# Patient Record
Sex: Male | Born: 1975 | Race: White | Hispanic: No | Marital: Single | Smoking: Former smoker
Health system: Southern US, Community
[De-identification: ages and names within clinical notes are randomized; demographics above are authoritative.]

## PROBLEM LIST (undated history)

## (undated) DIAGNOSIS — T7840XA Allergy, unspecified, initial encounter: Secondary | ICD-10-CM

## (undated) DIAGNOSIS — M87051 Idiopathic aseptic necrosis of right femur: Secondary | ICD-10-CM

## (undated) DIAGNOSIS — K5792 Diverticulitis of intestine, part unspecified, without perforation or abscess without bleeding: Secondary | ICD-10-CM

## (undated) DIAGNOSIS — M87052 Idiopathic aseptic necrosis of left femur: Secondary | ICD-10-CM

## (undated) DIAGNOSIS — R0602 Shortness of breath: Secondary | ICD-10-CM

## (undated) DIAGNOSIS — N420 Calculus of prostate: Secondary | ICD-10-CM

## (undated) DIAGNOSIS — E785 Hyperlipidemia, unspecified: Secondary | ICD-10-CM

## (undated) DIAGNOSIS — R079 Chest pain, unspecified: Secondary | ICD-10-CM

## (undated) HISTORY — DX: Shortness of breath: R06.02

## (undated) HISTORY — PX: COLONOSCOPY: SHX174

## (undated) HISTORY — PX: LUMBAR SPINE SURGERY: SHX701

## (undated) HISTORY — DX: Allergy, unspecified, initial encounter: T78.40XA

## (undated) HISTORY — DX: Hyperlipidemia, unspecified: E78.5

## (undated) HISTORY — DX: Chest pain, unspecified: R07.9

## (undated) HISTORY — DX: Idiopathic aseptic necrosis of left femur: M87.052

## (undated) HISTORY — DX: Idiopathic aseptic necrosis of right femur: M87.051

---

## 2002-12-22 ENCOUNTER — Ambulatory Visit (HOSPITAL_COMMUNITY): Admission: RE | Admit: 2002-12-22 | Discharge: 2002-12-22 | Payer: Self-pay | Admitting: Orthopedic Surgery

## 2002-12-22 ENCOUNTER — Encounter: Payer: Self-pay | Admitting: Orthopedic Surgery

## 2008-12-11 HISTORY — PX: VASECTOMY: SHX75

## 2010-02-01 ENCOUNTER — Ambulatory Visit: Payer: Self-pay | Admitting: Family Medicine

## 2010-02-02 LAB — CONVERTED CEMR LAB
ALT: 33 units/L (ref 0–53)
AST: 22 units/L (ref 0–37)
Albumin: 4.9 g/dL (ref 3.5–5.2)
Alkaline Phosphatase: 45 units/L (ref 39–117)
BUN: 15 mg/dL (ref 6–23)
CO2: 28 meq/L (ref 19–32)
Calcium: 9.5 mg/dL (ref 8.4–10.5)
Chloride: 102 meq/L (ref 96–112)
Cholesterol: 200 mg/dL (ref 0–200)
Creatinine, Ser: 1.05 mg/dL (ref 0.40–1.50)
Glucose, Bld: 91 mg/dL (ref 70–99)
HDL: 56 mg/dL (ref >39)
LDL Cholesterol: 107 mg/dL — ABNORMAL HIGH (ref 0–99)
Potassium: 4.4 meq/L (ref 3.5–5.3)
Sodium: 141 meq/L (ref 135–145)
Total Bilirubin: 1.1 mg/dL (ref 0.3–1.2)
Total CHOL/HDL Ratio: 3.6
Total Protein: 7.7 g/dL (ref 6.0–8.3)
Triglycerides: 186 mg/dL — ABNORMAL HIGH (ref <150)
VLDL: 37 mg/dL (ref 0–40)

## 2010-09-27 ENCOUNTER — Ambulatory Visit: Payer: Self-pay | Admitting: Family Medicine

## 2010-09-27 DIAGNOSIS — R109 Unspecified abdominal pain: Secondary | ICD-10-CM

## 2010-09-27 DIAGNOSIS — J069 Acute upper respiratory infection, unspecified: Secondary | ICD-10-CM | POA: Insufficient documentation

## 2010-09-28 DIAGNOSIS — R799 Abnormal finding of blood chemistry, unspecified: Secondary | ICD-10-CM

## 2010-09-28 LAB — CONVERTED CEMR LAB
ALT: 39 units/L (ref 0–53)
AST: 26 units/L (ref 0–37)
Albumin: 5 g/dL (ref 3.5–5.2)
Alkaline Phosphatase: 62 units/L (ref 39–117)
BUN: 15 mg/dL (ref 6–23)
Basophils Absolute: 0 10*3/uL (ref 0.0–0.1)
Basophils Relative: 0 % (ref 0–1)
CO2: 23 meq/L (ref 19–32)
Calcium: 9.4 mg/dL (ref 8.4–10.5)
Chloride: 103 meq/L (ref 96–112)
Creatinine, Ser: 1.15 mg/dL (ref 0.40–1.50)
Eosinophils Absolute: 0.2 10*3/uL (ref 0.0–0.7)
Eosinophils Relative: 4 % (ref 0–5)
Glucose, Bld: 88 mg/dL (ref 70–99)
HCT: 49.9 % (ref 39.0–52.0)
Hemoglobin: 16.1 g/dL (ref 13.0–17.0)
Lymphocytes Relative: 26 % (ref 12–46)
Lymphs Abs: 1.2 10*3/uL (ref 0.7–4.0)
MCHC: 32.3 g/dL (ref 30.0–36.0)
MCV: 95.4 fL (ref 78.0–100.0)
Monocytes Absolute: 0.5 10*3/uL (ref 0.1–1.0)
Monocytes Relative: 11 % (ref 3–12)
Neutro Abs: 2.8 10*3/uL (ref 1.7–7.7)
Neutrophils Relative %: 59 % (ref 43–77)
Platelets: 199 10*3/uL (ref 150–400)
Potassium: 4.3 meq/L (ref 3.5–5.3)
RBC: 5.23 M/uL (ref 4.22–5.81)
RDW: 13.7 % (ref 11.5–15.5)
Sodium: 139 meq/L (ref 135–145)
Total Bilirubin: 1.5 mg/dL — ABNORMAL HIGH (ref 0.3–1.2)
Total Protein: 7.8 g/dL (ref 6.0–8.3)
WBC: 4.8 10*3/uL (ref 4.0–10.5)

## 2010-09-29 ENCOUNTER — Ambulatory Visit: Payer: Self-pay | Admitting: Family Medicine

## 2010-09-29 LAB — CONVERTED CEMR LAB
Bilirubin Urine: NEGATIVE
Blood in Urine, dipstick: NEGATIVE
Glucose, Urine, Semiquant: NEGATIVE
Ketones, urine, test strip: NEGATIVE
Nitrite: NEGATIVE
Protein, U semiquant: NEGATIVE
Specific Gravity, Urine: 1.025
Urobilinogen, UA: 0.2
WBC Urine, dipstick: NEGATIVE
pH: 6.5

## 2010-09-30 LAB — CONVERTED CEMR LAB
Bacteria, UA: NONE SEEN
Bilirubin Urine: NEGATIVE
Casts: NONE SEEN /lpf
Creatinine, Urine: 100.7 mg/dL
Crystals: NONE SEEN
Hemoglobin, Urine: NEGATIVE
Ketones, ur: NEGATIVE mg/dL
Leukocytes, UA: NEGATIVE
Microalb Creat Ratio: 5 mg/g (ref 0.0–30.0)
Microalb, Ur: 0.5 mg/dL (ref 0.00–1.89)
Nitrite: NEGATIVE
Protein, ur: NEGATIVE mg/dL
Specific Gravity, Urine: 1.019 (ref 1.005–1.030)
Squamous Epithelial / HPF: NONE SEEN /lpf
Urine Glucose: NEGATIVE mg/dL
Urobilinogen, UA: 0.2 (ref 0.0–1.0)
pH: 6.5 (ref 5.0–8.0)

## 2010-10-07 ENCOUNTER — Encounter: Payer: Self-pay | Admitting: Family Medicine

## 2010-10-10 ENCOUNTER — Telehealth: Payer: Self-pay | Admitting: Family Medicine

## 2011-01-10 NOTE — Assessment & Plan Note (Signed)
Summary: U/A  Nurse Visit   Primary Care Provider:  Nani Gasser MD   History of Present Illness: pt dropped urine sample off fo u/a and further testing due to abn blood chemistry   Allergies: No Known Drug Allergies Laboratory Results   Urine Tests  Date/Time Received: 09/29/10 Date/Time Reported: 09/29/10  Routine Urinalysis   Color: yellow Appearance: Clear Glucose: negative   (Normal Range: Negative) Bilirubin: negative   (Normal Range: Negative) Ketone: negative   (Normal Range: Negative) Spec. Gravity: 1.025   (Normal Range: 1.003-1.035) Blood: negative   (Normal Range: Negative) pH: 6.5   (Normal Range: 5.0-8.0) Protein: negative   (Normal Range: Negative) Urobilinogen: 0.2   (Normal Range: 0-1) Nitrite: negative   (Normal Range: Negative) Leukocyte Esterace: negative   (Normal Range: Negative)       Orders Added: 1)  UA Dipstick w/o Micro (automated)  [81003]

## 2011-01-10 NOTE — Assessment & Plan Note (Signed)
Summary: URI, abd pain   Vital Signs:  Patient profile:   35 year old male Height:      69.5 inches Weight:      191 pounds Temp:     98.8 degrees F oral Pulse rate:   109 / minute BP sitting:   125 / 77  (right arm) Cuff size:   regular  Vitals Entered By: Avon Gully CMA, Duncan Dull) (September 27, 2010 10:44 AM) CC: productive cough,congestion since sunday,left ear stopped up, left eye draining this am ,cold and achey,lower abd pain, no fever   Primary Care Provider:  Nani Gasser MD  CC:  productive cough, congestion since sunday, left ear stopped up, left eye draining this am , cold and achey, lower abd pain, and no fever.  History of Present Illness: productive cough,congestion since sunday,  left ear stopped up, left eye draining this am ,cold and achey,lower abd pain, no fever. No fever. Some cold chills.  Head feels thick and bilat congestions.  Took some nyquail last night. No SOB. + sneezing.  Woke up this AM and had starp lower abdomen pain. Eased off after a few mintues. No real change in diet.  Doesn't happen during the day. NO change bowels.  No personal or family hx of GI or abdominal problems. No nausea.   Current Medications (verified): 1)  Multivitamins  Caps (Multiple Vitamin) .... One Tablet By Mouth Once A Day  Allergies (verified): No Known Drug Allergies  Comments:  Nurse/Medical Assistant: The patient's medications and allergies were reviewed with the patient and were updated in the Medication and Allergy Lists. Avon Gully CMA, Duncan Dull) (September 27, 2010 10:46 AM)  Past History:  Past Surgical History: Last updated: 02/01/2010 Vasectomy Jan 2010  Physical Exam  General:  Well-developed,well-nourished,in no acute distress; alert,appropriate and cooperative throughout examination Head:  Normocephalic and atraumatic without obvious abnormalities. No apparent alopecia or balding. Eyes:  No corneal or conjunctival inflammation noted. EOMI.  Perrla. Ears:  External ear exam shows no significant lesions or deformities.  Otoscopic examination reveals clear canals, tympanic membranes are intact bilaterally without bulging, retraction, inflammation or discharge. Hearing is grossly normal bilaterally. Nose:  External nasal examination shows no deformity or inflammation. Nasal mucosa are pink and moist without lesions or exudates. Mouth:  Oral mucosa and oropharynx without lesions or exudates.  Teeth in good repair. Neck:  No deformities, masses, or tenderness noted. Lungs:  Normal respiratory effort, chest expands symmetrically. Lungs are clear to auscultation, no crackles or wheezes. Heart:  Normal rate and regular rhythm. S1 and S2 normal without gallop, murmur, click, rub or other extra sounds. Abdomen:  Very tender to palpationon teh RLQ and LLQ with radiation of paint to opposite side.  soft, normal bowel sounds, no distention, no guarding, no rigidity, no rebound tenderness, no hepatomegaly, and no splenomegaly.   Pulses:  Radial 2+  Skin:  no rashes.   Cervical Nodes:  No lymphadenopathy noted Psych:  Cognition and judgment appear intact. Alert and cooperative with normal attention span and concentration. No apparent delusions, illusions, hallucinations   Impression & Recommendations:  Problem # 1:  URI (ICD-465.9)  Instructed on symptomatic treatment. Call if symptoms persist or worsen.   Problem # 2:  ABDOMINAL PAIN, LOWER (ICD-789.09) Unclear etiology. Will get labs to rule out infectious cuase. No change in bowels and no blood in stool. Call if sxs get worse  or if not getting better.  Will call with lab results. Pain is so short lived  that I don't think pain relievers would be helpful at this time.  Orders: T-Comprehensive Metabolic Panel [id number]) T-CBC w/Diff ([id number])  Complete Medication List: 1)  Multivitamins Caps (Multiple vitamin) .... One tablet by mouth once a day  Patient Instructions: 1)  You have  a cold. Can use the nyquail and daytime med to help relieve your symptoms. If you are not better in one week then call the office 2)  We will call you with your lab results. If abdominal pain gets worse or notice a change in bowels or blood in teh stool then call our office.   Orders Added: 1)  T-Comprehensive Metabolic Panel [[id number]] 2)  T-CBC w/Diff [[id number]] 3)  Est. Patient Level IV [96295]

## 2011-01-10 NOTE — Progress Notes (Signed)
Summary: Renal US.   Phone Note Outgoing Call   Summary of Call: Call pt: [place] of kidneys is normal. Good news. We can just follow the blood work and recheck in 6 months.  Initial call taken by: Nani Gasser MD,  October 10, 2010 10:43 AM  Follow-up for Phone Call        called and spoke with pt's wife and notified her of results Follow-up by: Avon Gully CMA, Duncan Dull),  October 10, 2010 11:31 AM

## 2011-01-10 NOTE — Assessment & Plan Note (Signed)
Summary: NOV: CPE   Vital Signs:  Patient profile:   35 year old male Height:      69.5 inches Weight:      191 pounds BMI:     27.90 Pulse rate:   70 / minute BP sitting:   119 / 69  (left arm) Cuff size:   regular  Vitals Entered By: Kathlene November (February 01, 2010 8:23 AM) CC: NP- get established and CPE Is Patient Diabetic? No   Primary Care Provider:  Nani Gasser MD  CC:  NP- get established and CPE.  History of Present Illness: Had an exercise treadmill stress tes about 5-6 years agot. Having intermittant chest pain. Feels like an elephant on the chest.  Seems to be random. Happens when more stressed.  Can last 3-4 days at a  time. No neck pain. No SOB.  No arm numbness or pain.  No CP over the last several weeks.   Habits & Providers  Alcohol-Tobacco-Diet     Alcohol drinks/day: <1     Tobacco Status: quit     Year Quit: 1999  Exercise-Depression-Behavior     Does Patient Exercise: yes     Type of exercise: weight lifting, cardio     Exercise (avg: min/session): >60     Times/week: <3     STD Risk: never     Drug Use: no     Seat Belt Use: always  Current Medications (verified): 1)  None  Allergies (verified): No Known Drug Allergies  Comments:  Nurse/Medical Assistant: The patient's medications and allergies were reviewed with the patient and were updated in the Medication and Allergy Lists. 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  Some college. Married ot [place] with 2 kids.  Former Smoker Alcohol , 3-6/ week  Drug use-no Regular exercise-yes, 2-3 x per week.  Smoking Status:  quit Does Patient Exercise:  yes STD Risk:  never Drug Use:  no Seat Belt Use:  always  Review of Systems       No  fever/sweats/weakness, unexplained weight loss/gain.  No vison changes.  No difficulty hearing/ringing in ears, hay fever/allergies.  + chest pain/discomfort, no palpitations.  No Br lump/nipple discharge.  No cough/wheeze.  No blood in BM, nausea/vomiting/diarrhea.  No nighttime urination, leaking urine, unusual vaginal bleeding, discharge (penis or vagina).  No muscle/joint pain. No rash, change in mole.  No HA, memory loss.  No anxiety, sleep d/o, depression.  No easy bruising/bleeding, unexplained lump   Physical Exam  General:  Well-developed,well-nourished,in no acute distress; alert,appropriate and cooperative throughout examination Head:  Normocephalic and atraumatic without obvious abnormalities. No apparent alopecia or balding. Eyes:  No corneal or conjunctival inflammation noted. EOMI. Perrla. Ears:  External ear exam shows no significant lesions or deformities.  Otoscopic examination reveals clear canals, tympanic membranes are intact bilaterally without bulging, retraction, inflammation or discharge. Hearing is grossly normal bilaterally. Nose:  External nasal examination shows no deformity or inflammation. Nasal mucosa are pink and moist without lesions or exudates. Mouth:  Oral mucosa and oropharynx without lesions or exudates.  Teeth in good repair. Neck:  No deformities, masses, or tenderness noted. Lungs:  Normal respiratory effort, chest expands symmetrically. Lungs are clear to auscultation, no crackles or wheezes. Heart:  Normal rate and regular rhythm. S1 and S2  normal without gallop, murmur, click, rub or other extra sounds. No carotid or abdominal bruits.  Abdomen:  Bowel sounds positive,abdomen soft and non-tender without masses, organomegaly or hernias noted. Msk:  No deformity or scoliosis noted of thoracic or lumbar spine.   Pulses:  R and L carotid,radial,dorsalis pedis and posterior tibial pulses are full and equal bilaterally Extremities:  No clubbing, cyanosis, edema,  or deformity noted with normal full range of motion of all joints.   Neurologic:  No cranial nerve deficits noted. Station and gait are normal.  DTRs are symmetrical throughout. Sensory, motor and coordinative functions appear intact. Skin:  no rashes.   Cervical Nodes:  No lymphadenopathy noted Psych:  Cognition and judgment appear intact. Alert and cooperative with normal attention span and concentration. No apparent delusions, illusions, hallucinations   Impression & Recommendations:  Problem # 1:  Preventive Health Care (ICD-V70.0) Exam is normal.  Tetanus is up to date.  Encourage more regular exercise and low fat diet.  Due for screening labs.   Other Orders: T-Comprehensive Metabolic Panel [id number]) T-Lipid Profile [id number])  Appended Document: NOV: CPE  TD Result Date:  12/12/2003 TD Result:  given TD Next Due:  10 yr

## 2011-04-12 ENCOUNTER — Ambulatory Visit (INDEPENDENT_AMBULATORY_CARE_PROVIDER_SITE_OTHER): Payer: 59 | Admitting: Family Medicine

## 2011-04-12 VITALS — BP 133/87 | HR 62 | Ht 69.25 in | Wt 200.0 lb

## 2011-04-12 DIAGNOSIS — E781 Pure hyperglyceridemia: Secondary | ICD-10-CM

## 2011-04-12 DIAGNOSIS — R0683 Snoring: Secondary | ICD-10-CM

## 2011-04-12 DIAGNOSIS — R0989 Other specified symptoms and signs involving the circulatory and respiratory systems: Secondary | ICD-10-CM

## 2011-04-12 DIAGNOSIS — R030 Elevated blood-pressure reading, without diagnosis of hypertension: Secondary | ICD-10-CM

## 2011-04-12 NOTE — Progress Notes (Signed)
  Subjective:    Patient ID: Andrew Frost, male    DOB: 05/07/1976, 36 y.o.   MRN: [id number]  HPI Nightly snoring. No HA in the AM.  Father has sleep apnea.  Some recent mood change b/c on prednisone for his back.  He is also in pain. Had a recent back pain. Will often nap after lunch for about 20-30 min. he does not feel he has any significant daytime fatigue. Wife has has never observed any apnea.  No throat problems. No difficulty swallowing or recurrent throat infections.  His BP has been high, the last couple times he has been at orthopedist..  She has also been in pain with his back which could be contributing. He has no prior diagnosis of high blood pressure. He says he has gained some weight while being on the prednisone. But he says he is not eating more. He does feel swollen his face and abdomen on the prednisone..   Chol was elevated last year. Due to recheck. He has tried ot make dietary changes.   Review of Systems     Objective:   Physical Exam  Constitutional: He is oriented to person, place, and time. He appears well-developed and well-nourished.  HENT:  Head: Normocephalic and atraumatic.  Nose: Nose normal.  Mouth/Throat: Oropharynx is clear and moist.       He does not have large tonsils. He does have a fair amount of fat around his neck.  Eyes: Conjunctivae are normal. Pupils are equal, round, and reactive to light.  Neck: No tracheal deviation present. No thyromegaly present.  Cardiovascular: Normal rate, regular rhythm and normal heart sounds.   Pulmonary/Chest: Effort normal and breath sounds normal.  Lymphadenopathy:    He has no cervical adenopathy.  Neurological: He is alert and oriented to person, place, and time.  Psychiatric: He has a normal mood and affect.          Assessment & Plan:  Snoring-do recommend that we schedule him for a sleep study to evaluate him for sleep apnea. If this is normal then we can certainly address the snoring itself which  might be alleviated with some type of appliance. He really doesn't have a lot of other symptoms of sleep apnea but he does have a positive family history.  Elevated blood pressure without diagnosis of hypertension-his blood pressure is borderline but is technically a call today. We discussed continuing to follow this and see if it improves especially once he is off the steroids and his pain is under better control with his back.  Hypercholesterolemia-due to recheck his lab values.

## 2011-04-12 NOTE — Patient Instructions (Signed)
We will call you with your lab results 

## 2011-04-13 LAB — LIPID PANEL
Cholesterol: 199 mg/dL (ref 0–200)
HDL: 70 mg/dL (ref >39)
Triglycerides: 122 mg/dL (ref <150)
VLDL: 24 mg/dL (ref 0–40)

## 2011-04-14 ENCOUNTER — Telehealth: Payer: Self-pay | Admitting: Family Medicine

## 2011-04-14 LAB — COMPLETE METABOLIC PANEL WITH GFR
ALT: 68 U/L — ABNORMAL HIGH (ref 0–53)
AST: 19 U/L (ref 0–37)
BUN: 22 mg/dL (ref 6–23)
Calcium: 8.9 mg/dL (ref 8.4–10.5)
Chloride: 101 mEq/L (ref 96–112)
Creat: 1.18 mg/dL (ref 0.40–1.50)
GFR, Est African American: >60 mL/min (ref >60)
Total Bilirubin: 0.9 mg/dL (ref 0.3–1.2)

## 2011-04-14 NOTE — Telephone Encounter (Signed)
Pt.notified

## 2011-04-14 NOTE — Telephone Encounter (Signed)
Call patient: Triglycerides look much better. Complete metabolic panel is okay except for her liver enzymes and elevated. Avoid Tylenol and alcohol a month recheck that liver enzyme in 3-4 weeks.

## 2011-05-03 ENCOUNTER — Encounter: Payer: Self-pay | Admitting: Family Medicine

## 2011-06-08 ENCOUNTER — Encounter: Payer: Self-pay | Admitting: Family Medicine

## 2011-11-23 ENCOUNTER — Encounter: Payer: Self-pay | Admitting: Family Medicine

## 2011-11-23 ENCOUNTER — Ambulatory Visit (INDEPENDENT_AMBULATORY_CARE_PROVIDER_SITE_OTHER): Payer: 59 | Admitting: Family Medicine

## 2011-11-23 DIAGNOSIS — J020 Streptococcal pharyngitis: Secondary | ICD-10-CM

## 2011-11-23 DIAGNOSIS — R509 Fever, unspecified: Secondary | ICD-10-CM

## 2011-11-23 DIAGNOSIS — J111 Influenza due to unidentified influenza virus with other respiratory manifestations: Secondary | ICD-10-CM

## 2011-11-23 LAB — POCT RAPID STREP A (OFFICE): Rapid Strep A Screen: POSITIVE — AB

## 2011-11-23 MED ORDER — KETOROLAC TROMETHAMINE 60 MG/2ML IM SOLN
60.0000 mg | Freq: Once | INTRAMUSCULAR | Status: AC
  Administered 2011-11-23: 60 mg via INTRAMUSCULAR

## 2011-11-23 MED ORDER — OSELTAMIVIR PHOSPHATE 75 MG PO CAPS: 75.0000 mg | ORAL_CAPSULE | Freq: Two times a day (BID) | ORAL | Status: AC

## 2011-11-23 MED ORDER — HYDROCOD POLST-CHLORPHEN POLST 10-8 MG/5ML PO LQCR: 5.0000 mL | Freq: Two times a day (BID) | ORAL | Status: DC | PRN

## 2011-11-23 MED ORDER — MELOXICAM 7.5 MG PO TABS: 7.5000 mg | ORAL_TABLET | Freq: Every day | ORAL | Status: AC

## 2011-11-23 NOTE — Progress Notes (Signed)
  Subjective:    Patient ID: Andrew Frost, male    DOB: 1976-07-12, 35 y.o.   MRN: [id number]  HPI Patient's here because of a sore throat nasal congestion since he started feeling lightheaded URI Sunday Monday but really didn't have much symptoms Tuesday evening he started feeling achy not feeling well and then Wednesday he was basically morning was full-blown of 2 with a he's felt as route and and now her status as felt worse. He has had a sore throat he has had a flu shot not at this facility but elsewhere on September and he states that he feels miserable and aching all over including his eyes chest back and legs.   Review of Systems  Constitutional: Positive for fever, chills, activity change, appetite change and fatigue.  HENT: Positive for congestion, sore throat, rhinorrhea, postnasal drip and sinus pressure.   Respiratory: Positive for cough and chest tightness.   Musculoskeletal: Positive for myalgias and arthralgias. Negative for back pain.  Skin: Negative for color change.  Neurological: Positive for dizziness.  All other systems reviewed and are negative.       Objective:   Physical Exam  Constitutional: He is oriented to person, place, and time. He appears well-developed and well-nourished.  HENT:  Head: Normocephalic.  Right Ear: External ear normal. Tympanic membrane is erythematous.  Left Ear: External ear normal. Tympanic membrane is erythematous.  Nose: Mucosal edema and rhinorrhea present. No sinus tenderness.  Mouth/Throat: Posterior oropharyngeal edema and posterior oropharyngeal erythema present.  Neck: Neck supple.  Cardiovascular: Normal rate, regular rhythm and normal heart sounds.   Pulmonary/Chest: Effort normal and breath sounds normal. No respiratory distress. He has no wheezes.  Lymphadenopathy:    He has cervical adenopathy.  Neurological: He is alert and oriented to person, place, and time.  Skin: Skin is warm.  Psychiatric: He has a normal mood  and affect. His behavior is normal.   BP 127/77  Pulse 126  Temp(Src) 100.8 F (38.2 C) (Oral)  Ht 5\' 10"  (1.778 m)  Wt 186 lb (84.369 kg)  BMI 26.69 kg/m2  SpO2 100%      . Results for orders placed in visit on 11/23/11  POCT INFLUENZA A/B      Component Value Range   Influenza A, POC Negative     Influenza B, POC Negative    POCT RAPID STREP A (OFFICE)      Component Value Range   Rapid Strep A Screen Positive (*) Negative    Assessment & Plan:  #1 probable flu since she was started having symptoms of a flulike Tuesday evening for that he is within the range of getting Tamiflu as we will give him Tamiflu 75 mg twice a day also for the cough Tussionex 1 teaspoon twice a day Mobic aches and pain and will give him a shot of Toradol now before he goes. #2 problem strep throat because he is so miserable somel and running a fever will administer LA Bicillin 1.2 million units IM and not return to work until Monday 12/17/ 2012. Return in 2 weeks for nurse's visit to have repeat strep test

## 2011-11-23 NOTE — Patient Instructions (Signed)
Influenza Facts Flu (influenza) is a contagious respiratory illness caused by the influenza viruses. It can cause mild to severe illness. While most healthy people recover from the flu without specific treatment and without complications, older people, young children, and people with certain health conditions are at higher risk for serious complications from the flu, including death. CAUSES   The flu virus is spread from person to person by respiratory droplets from coughing and sneezing.   A person can also become infected by touching an object or surface with a virus on it and then touching their mouth, eye or nose.   Adults may be able to infect others from 1 day before symptoms occur and up to 7 days after getting sick. So it is possible to give someone the flu even before you know you are sick and continue to infect others while you are sick.  SYMPTOMS   Fever (usually high).   Headache.   Tiredness (can be extreme).   Cough.   Sore throat.   Runny or stuffy nose.   Body aches.   Diarrhea and vomiting may also occur, particularly in children.   These symptoms are referred to as "flu-like symptoms". A lot of different illnesses, including the common cold, can have similar symptoms.  DIAGNOSIS   There are tests that can determine if you have the flu as long you are tested within the first 2 or 3 days of illness.   A doctor's exam and additional tests may be needed to identify if you have a disease that is a complicating the flu.  RISKS AND COMPLICATIONS  Some of the complications caused by the flu include:  Bacterial pneumonia or progressive pneumonia caused by the flu virus.   Loss of body fluids (dehydration).   Worsening of chronic medical conditions, such as heart failure, asthma, or diabetes.   Sinus problems and ear infections.  HOME CARE INSTRUCTIONS   Seek medical care early on.   If you are at high risk from complications of the flu, consult your health-care  provider as soon as you develop flu-like symptoms. Those at high risk for complications include:   People 65 years or older.   People with chronic medical conditions, including diabetes.   Pregnant women.   Young children.   Your caregiver may recommend use of an antiviral medication to help treat the flu.   If you get the flu, get plenty of rest, drink a lot of liquids, and avoid using alcohol and tobacco.   You can take over-the-counter medications to relieve the symptoms of the flu if your caregiver approves. (Never give aspirin to children or teenagers who have flu-like symptoms, particularly fever).  PREVENTION  The single best way to prevent the flu is to get a flu vaccine each fall. Other measures that can help protect against the flu are:  Antiviral Medications   A number of antiviral drugs are approved for use in preventing the flu. These are prescription medications, and a doctor should be consulted before they are used.   Habits for Good Health   Cover your nose and mouth with a tissue when you cough or sneeze, throw the tissue away after you use it.   Wash your hands often with soap and water, especially after you cough or sneeze. If you are not near water, use an alcohol-based hand cleaner.   Avoid people who are sick.   If you get the flu, stay home from work or school. Avoid contact with   other people so that you do not make them sick, too.   Try not to touch your eyes, nose, or mouth as germs ore often spread this way.  IN CHILDREN, EMERGENCY WARNING SIGNS THAT NEED URGENT MEDICAL ATTENTION:  Fast breathing or trouble breathing.   Bluish skin color.   Not drinking enough fluids.   Not waking up or not interacting.   Being so irritable that the child does not want to be held.   Flu-like symptoms improve but then return with fever and worse cough.   Fever with a rash.  IN ADULTS, EMERGENCY WARNING SIGNS THAT NEED URGENT MEDICAL ATTENTION:  Difficulty  breathing or shortness of breath.   Pain or pressure in the chest or abdomen.   Sudden dizziness.   Confusion.   Severe or persistent vomiting.  SEEK IMMEDIATE MEDICAL CARE IF:  You or someone you know is experiencing any of the symptoms above. When you arrive at the emergency center,report that you think you have the flu. You may be asked to wear a mask and/or sit in a secluded area to protect others from getting sick. MAKE SURE YOU:   Understand these instructions.   Monitor your condition.   Seek medical care if you are getting worse, or not improving.  Document Released: 11/30/2003 Document Revised: 08/09/2011 Document Reviewed: 08/26/2009 [facility] Patient Information 2012 [place], [place].Strep Infections Streptococcal (strep) infections are caused by streptococcal germs (bacteria). Strep infections are very contagious. Strep infections can occur in:  Ears.   The nose.   The throat.   Sinuses.   Skin.   Blood.   Lungs.   Spinal fluid.   Urine.  Strep throat is the most common bacterial infection in children. The symptoms of a Strep infection usually get better in 2 to 3 days after starting medicine that kills germs (antibiotics). Strep is usually not contagious after 36 to 48 hours of antibiotic treatment. Strep infections that are not treated can cause serious complications. These include gland infections, throat abscess, rheumatic fever and kidney disease. DIAGNOSIS  The diagnosis of strep is made by:  A culture for the strep germ.  TREATMENT  These infections require oral antibiotics for a full 10 days, an antibiotic shot or antibiotics given into the vein (intravenous, IV). HOME CARE INSTRUCTIONS   Be sure to finish all antibiotics even if feeling better.   Only take over-the-counter medicines for pain, discomfort and or fever, as directed by your caregiver.   Close contacts that have a fever, sore throat or illness symptoms should see their caregiver right  away.   You or your child may return to work, school or daycare if the fever and pain are better in 2 to 3 days after starting antibiotics.  SEEK MEDICAL CARE IF:   You or your child has an oral temperature above 102 F (38.9 C).   Your baby is older than 3 months with a rectal temperature of 100.5 F (38.1 C) or higher for more than 1 day.   You or your child is not better in 3 days.  SEEK IMMEDIATE MEDICAL CARE IF:   You or your child has an oral temperature above 102 F (38.9 C), not controlled by medicine.   Your baby is older than 3 months with a rectal temperature of 102 F (38.9 C) or higher.   Your baby is 75 months old or younger with a rectal temperature of 100.4 F (38 C) or higher.   There is a spreading rash.  There is difficulty swallowing or breathing.   There is increased pain or swelling.  Document Released: 01/04/2005 Document Revised: 08/09/2011 Document Reviewed: 10/13/2009 [facility] Patient Information 2012 [place], [place].

## 2015-03-29 ENCOUNTER — Encounter: Payer: Self-pay | Admitting: Family Medicine

## 2015-03-29 ENCOUNTER — Ambulatory Visit (INDEPENDENT_AMBULATORY_CARE_PROVIDER_SITE_OTHER): Payer: BLUE CROSS/BLUE SHIELD | Admitting: Family Medicine

## 2015-03-29 VITALS — BP 150/87 | HR 89 | Ht 70.0 in | Wt 175.0 lb

## 2015-03-29 DIAGNOSIS — S46819A Strain of other muscles, fascia and tendons at shoulder and upper arm level, unspecified arm, initial encounter: Secondary | ICD-10-CM | POA: Diagnosis not present

## 2015-03-29 DIAGNOSIS — M542 Cervicalgia: Secondary | ICD-10-CM

## 2015-03-29 MED ORDER — CYCLOBENZAPRINE HCL 10 MG PO TABS: 10.0000 mg | ORAL_TABLET | Freq: Every evening | ORAL | Status: DC | PRN

## 2015-03-29 NOTE — Progress Notes (Signed)
   Subjective:    Patient ID: Andrew Frost, male    DOB: 1976/02/12, 39 y.o.   MRN: [id number]  HPI Neck pain x 5 days. Wakes up tight in Am and better as the day goes on. Say has been under more stress recently.  Feels sore. Has tried to rub it and doesn't really hlep has noticed dec ROM.  Occ pain radiates into the back of scalp.  No injury or trauma.  No change in eating pattern. No other joint pain.  No popping or grinding.      Review of Systems     Objective:   Physical Exam  Constitutional: He appears well-developed and well-nourished.  HENT:  Head: Normocephalic and atraumatic.  Musculoskeletal:  Normal cervical flexion. Decreased extension. Decreased rotation left and right to about 45. He's able to go just a little bit more on the right compared to the left. Side bending is also somewhat decreased bilaterally secondary to pain. He is nontender of the cervical or thoracic spine. He does have some palpable tension and knots in the upper trapezius just above the scapula. He also has a lot of tension between the spine and the scapula. He also has a lot of tenderness at the base of the occiput near the facets  Skin: Skin is warm and dry.  Psychiatric: He has a normal mood and affect. His behavior is normal.          Assessment & Plan:  Cervical pain/strain with trapezius strain-recommend physical therapy at this point since she's had symptoms for 5 weeks with no improvement with conservative care. Recommend a trial of Flexeril at bedtime and getting him into PT. If he's not significantly improved after about a month and would like to see him back. Did not opt to do x-rays today with no history of injury or trauma per se. But if it suddenly gets worse then please call his back.

## 2015-04-09 LAB — HEMOGLOBIN A1C: HEMOGLOBIN A1C: 5.4 % (ref 4.0–6.0)

## 2015-04-09 LAB — BASIC METABOLIC PANEL: GLUCOSE: 90 mg/dL

## 2015-04-09 LAB — LIPID PANEL
Cholesterol: 204 mg/dL — AB (ref 0–200)
HDL: 53 mg/dL (ref 35–70)
LDL Cholesterol: 131 mg/dL
Triglycerides: 102 mg/dL (ref 40–160)

## 2015-04-23 ENCOUNTER — Ambulatory Visit (INDEPENDENT_AMBULATORY_CARE_PROVIDER_SITE_OTHER): Payer: BLUE CROSS/BLUE SHIELD | Admitting: Family Medicine

## 2015-04-23 ENCOUNTER — Encounter: Payer: Self-pay | Admitting: Family Medicine

## 2015-04-23 VITALS — BP 123/87 | HR 70 | Wt 178.0 lb

## 2015-04-23 DIAGNOSIS — Z0189 Encounter for other specified special examinations: Secondary | ICD-10-CM

## 2015-04-23 DIAGNOSIS — Z23 Encounter for immunization: Secondary | ICD-10-CM | POA: Diagnosis not present

## 2015-04-23 DIAGNOSIS — Z0000 Encounter for general adult medical examination without abnormal findings: Secondary | ICD-10-CM

## 2015-04-23 LAB — COMPLETE METABOLIC PANEL WITH GFR
ALK PHOS: 40 U/L (ref 39–117)
ALT: 24 U/L (ref 0–53)
AST: 18 U/L (ref 0–37)
Albumin: 4.7 g/dL (ref 3.5–5.2)
BILIRUBIN TOTAL: 1.2 mg/dL (ref 0.2–1.2)
BUN: 16 mg/dL (ref 6–23)
CALCIUM: 9.3 mg/dL (ref 8.4–10.5)
CO2: 30 mEq/L (ref 19–32)
CREATININE: 1.01 mg/dL (ref 0.50–1.35)
Chloride: 103 mEq/L (ref 96–112)
GFR, Est Non African American: >89 mL/min
Glucose, Bld: 82 mg/dL (ref 70–99)
POTASSIUM: 4.7 meq/L (ref 3.5–5.3)
SODIUM: 139 meq/L (ref 135–145)
TOTAL PROTEIN: 7.5 g/dL (ref 6.0–8.3)

## 2015-04-23 LAB — LIPID PANEL
Cholesterol: 214 mg/dL — ABNORMAL HIGH (ref 0–200)
HDL: 57 mg/dL (ref >=40)
LDL CALC: 135 mg/dL — AB (ref 0–99)
TRIGLYCERIDES: 110 mg/dL (ref <150)
Total CHOL/HDL Ratio: 3.8 Ratio
VLDL: 22 mg/dL (ref 0–40)

## 2015-04-23 MED ORDER — TETANUS-DIPHTH-ACELL PERTUSSIS 5-2.5-18.5 LF-MCG/0.5 IM SUSP
0.5000 mL | Freq: Once | INTRAMUSCULAR | Status: AC
  Administered 2015-04-23: 0.5 mL via INTRAMUSCULAR

## 2015-04-23 NOTE — Progress Notes (Signed)
Subjective:    Patient ID: Andrew Frost, male    DOB: [birth year], 39 y.o.   MRN: [id number]  HPI Here for CPE - here today for complete physical exam. He did have some blood work done through his job through [facility] [employer]. They did do a cholesterol panel as well as glucose and hemoglobin A1c. His A1c was 5.4 which is fantastic. Total cholesterol 204, HDL 53, LDL 131, checklist rides 102. BMI is 26 which puts him into the overweight category. Blood pressure looked great that day as well as pulse. Labs were from 04/09/2015.  Review of Systems comphrehensive ROS is neg.   BP 123/87 mmHg  Pulse 70  Wt 178 lb (80.74 kg)  SpO2 99%    No Known Allergies  No past medical history on file.  Past Surgical History  Procedure Laterality Date  . Vasectomy  12/2008  . Lumbar spine surgery  20012    disc issues.     History   Social History  . Marital Status: Married    Spouse Name: N/A  . Number of Children: N/A  . Years of Education: N/A   Occupational History  . Not on file.   Social History Main Topics  . Smoking status: Never Smoker   . Smokeless tobacco: Former Systems developer  . Alcohol Use: 0.0 oz/week    3-6 drink(s) per week  . Drug Use: No  . Sexual Activity: Not on file   Other Topics Concern  . Not on file   Social History Narrative   He is very active.      Family History  Problem Relation Age of Onset  . Alcohol abuse      parents  . Depression      parents  . Hypertension      grandparents  . Hyperlipidemia      grandparents  . Heart disease      Outpatient Encounter Prescriptions as of 04/23/2015  Medication Sig  . Cyanocobalamin (B-12) 1000 MCG CAPS Take by mouth.    . Multiple Vitamin (MULTIVITAMIN) capsule Take 1 capsule by mouth daily.    . [DISCONTINUED] cyclobenzaprine (FLEXERIL) 10 MG tablet Take 1 tablet (10 mg total) by mouth at bedtime as needed for muscle spasms.  . [EXPIRED] Tdap (BOOSTRIX) injection 0.5 mL    No facility-administered  encounter medications on file as of 04/23/2015.          Objective:   Physical Exam  Constitutional: He is oriented to person, place, and time. He appears well-developed and well-nourished.  HENT:  Head: Normocephalic and atraumatic.  Right Ear: External ear normal.  Left Ear: External ear normal.  Nose: Nose normal.  Mouth/Throat: Oropharynx is clear and moist.  Eyes: Conjunctivae and EOM are normal. Pupils are equal, round, and reactive to light.  Neck: Normal range of motion. Neck supple. No thyromegaly present.  Cardiovascular: Normal rate, regular rhythm, normal heart sounds and intact distal pulses.   Pulmonary/Chest: Effort normal and breath sounds normal.  Abdominal: Soft. Bowel sounds are normal. He exhibits no distension and no mass. There is no tenderness. There is no rebound and no guarding.  Musculoskeletal: Normal range of motion.  Lymphadenopathy:    He has no cervical adenopathy.  Neurological: He is alert and oriented to person, place, and time. He has normal reflexes.  Skin: Skin is warm and dry.  Psychiatric: He has a normal mood and affect. His behavior is normal. Judgment and thought content normal.  Assessment & Plan:  CPE- Keep up a regular exercise program and make sure you are eating a healthy diet Try to eat 4 servings of dairy a day, or if you are lactose intolerant take a calcium with vitamin D daily.  Your vaccines are up to date.  Due for Tdap.

## 2015-04-23 NOTE — Patient Instructions (Signed)
Keep up a regular exercise program and make sure you are eating a healthy diet Try to eat 4 servings of dairy a day, or if you are lactose intolerant take a calcium with vitamin D daily.  Your vaccines are up to date.   

## 2015-04-26 ENCOUNTER — Encounter: Payer: Self-pay | Admitting: Family Medicine

## 2015-04-26 DIAGNOSIS — E785 Hyperlipidemia, unspecified: Secondary | ICD-10-CM

## 2015-04-26 HISTORY — DX: Hyperlipidemia, unspecified: E78.5

## 2015-05-04 ENCOUNTER — Encounter: Payer: Self-pay | Admitting: Family Medicine

## 2016-01-21 ENCOUNTER — Ambulatory Visit (INDEPENDENT_AMBULATORY_CARE_PROVIDER_SITE_OTHER): Payer: BLUE CROSS/BLUE SHIELD | Admitting: Sports Medicine

## 2016-01-21 ENCOUNTER — Ambulatory Visit (HOSPITAL_BASED_OUTPATIENT_CLINIC_OR_DEPARTMENT_OTHER)
Admission: RE | Admit: 2016-01-21 | Discharge: 2016-01-21 | Disposition: A | Discharge status: Home / Self Care | Payer: BLUE CROSS/BLUE SHIELD | Source: Ambulatory Visit | Attending: Sports Medicine | Admitting: Sports Medicine

## 2016-01-21 ENCOUNTER — Encounter (HOSPITAL_BASED_OUTPATIENT_CLINIC_OR_DEPARTMENT_OTHER): Payer: Self-pay

## 2016-01-21 ENCOUNTER — Encounter: Payer: Self-pay | Admitting: Sports Medicine

## 2016-01-21 VITALS — BP 141/89 | HR 82 | Resp 18 | Wt 205.0 lb

## 2016-01-21 DIAGNOSIS — R1031 Right lower quadrant pain: Secondary | ICD-10-CM | POA: Diagnosis not present

## 2016-01-21 DIAGNOSIS — M87052 Idiopathic aseptic necrosis of left femur: Secondary | ICD-10-CM | POA: Diagnosis not present

## 2016-01-21 DIAGNOSIS — M879 Osteonecrosis, unspecified: Secondary | ICD-10-CM | POA: Insufficient documentation

## 2016-01-21 DIAGNOSIS — M87051 Idiopathic aseptic necrosis of right femur: Secondary | ICD-10-CM | POA: Diagnosis not present

## 2016-01-21 DIAGNOSIS — N420 Calculus of prostate: Secondary | ICD-10-CM | POA: Insufficient documentation

## 2016-01-21 LAB — CBC WITH DIFFERENTIAL/PLATELET
Basophils Absolute: 0 K/uL (ref 0.0–0.1)
Basophils Relative: 0 % (ref 0–1)
Eosinophils Absolute: 0.1 K/uL (ref 0.0–0.7)
Eosinophils Relative: 1 % (ref 0–5)
HCT: 43.9 % (ref 39.0–52.0)
Hemoglobin: 14.6 g/dL (ref 13.0–17.0)
Lymphocytes Relative: 29 % (ref 12–46)
Lymphs Abs: 1.8 K/uL (ref 0.7–4.0)
MCH: 29.3 pg (ref 26.0–34.0)
MCHC: 33.3 g/dL (ref 30.0–36.0)
MCV: 88.2 fL (ref 78.0–100.0)
MPV: 10.3 fL (ref 8.6–12.4)
Monocytes Absolute: 0.5 K/uL (ref 0.1–1.0)
Monocytes Relative: 8 % (ref 3–12)
Neutro Abs: 3.9 10*3/uL (ref 1.7–7.7)
Neutrophils Relative %: 62 % (ref 43–77)
Platelets: 220 K/uL (ref 150–400)
RBC: 4.98 MIL/uL (ref 4.22–5.81)
RDW: 14.2 % (ref 11.5–15.5)
WBC: 6.3 10*3/uL (ref 4.0–10.5)

## 2016-01-21 LAB — POCT URINALYSIS DIPSTICK
Bilirubin, UA: NEGATIVE
Glucose, UA: NEGATIVE
Ketones, UA: NEGATIVE
Leukocytes, UA: NEGATIVE
Nitrite, UA: NEGATIVE
Protein, UA: NEGATIVE
Spec Grav, UA: 1.02
Urobilinogen, UA: 0.2
pH, UA: 7

## 2016-01-21 MED ORDER — IOHEXOL 300 MG/ML  SOLN
100.0000 mL | Freq: Once | INTRAMUSCULAR | Status: AC | PRN
  Administered 2016-01-21: 100 mL via INTRAVENOUS

## 2016-01-21 MED ORDER — IOHEXOL 300 MG/ML  SOLN
50.0000 mL | Freq: Once | INTRAMUSCULAR | Status: AC | PRN
  Administered 2016-01-21: 50 mL via ORAL

## 2016-01-21 MED ORDER — METRONIDAZOLE 500 MG PO TABS: 500.0000 mg | ORAL_TABLET | Freq: Two times a day (BID) | ORAL | Status: DC

## 2016-01-21 MED ORDER — CIPROFLOXACIN HCL 750 MG PO TABS: 750.0000 mg | ORAL_TABLET | Freq: Two times a day (BID) | ORAL | Status: DC

## 2016-01-21 NOTE — Progress Notes (Signed)
  Subjective:    CC: Abdominal pain  HPI: This is a pleasant 40 year old male with a history of diverticulitis, he comes in with a several day history of worsening severe abdominal pain localized in the suprapubic region and the right lower quadrant, no nausea, vomiting, diarrhea. No constitutional symptoms.  Past medical history, Surgical history, Family history not pertinant except as noted below, Social history, Allergies, and medications have been entered into the medical record, reviewed, and no changes needed.   Review of Systems: No fevers, chills, night sweats, weight loss, chest pain, or shortness of breath.   Objective:    General: Well Developed, well nourished, and in no acute distress.  Neuro: Alert and oriented x3, extra-ocular muscles intact, sensation grossly intact.  HEENT: Normocephalic, atraumatic, pupils equal round reactive to light, neck supple, no masses, no lymphadenopathy, thyroid nonpalpable.  Skin: Warm and dry, no rashes. Cardiac: Regular rate and rhythm, no murmurs rubs or gallops, no lower extremity edema.  Respiratory: Clear to auscultation bilaterally. Not using accessory muscles, speaking in full sentences. Abdomen: Soft, tender to palpation with guarding in the right lower quadrant and the suprapubic region. Normal bowel sounds, no palpable masses. Rectal: Smooth prostate, good tone, no tenderness.  Impression and Recommendations:

## 2016-01-21 NOTE — Addendum Note (Signed)
Addended by: Elizabeth Sauer on: 01/21/2016 05:13 PM   Modules accepted: Orders

## 2016-01-22 LAB — COMPREHENSIVE METABOLIC PANEL WITH GFR
ALT: 41 U/L (ref 9–46)
AST: 22 U/L (ref 10–40)
Alkaline Phosphatase: 48 U/L (ref 40–115)
BUN: 12 mg/dL (ref 7–25)
Creat: 1.03 mg/dL (ref 0.60–1.35)
Glucose, Bld: 73 mg/dL (ref 65–99)
Potassium: 4.2 mmol/L (ref 3.5–5.3)
Sodium: 139 mmol/L (ref 135–146)
Total Protein: 7.2 g/dL (ref 6.1–8.1)

## 2016-01-22 LAB — COMPREHENSIVE METABOLIC PANEL
Albumin: 4.4 g/dL (ref 3.6–5.1)
CO2: 28 mmol/L (ref 20–31)
Calcium: 9.4 mg/dL (ref 8.6–10.3)
Chloride: 102 mmol/L (ref 98–110)
Total Bilirubin: 0.8 mg/dL (ref 0.2–1.2)

## 2016-01-22 LAB — LIPASE: Lipase: 33 U/L (ref 7–60)

## 2016-01-22 LAB — AMYLASE: Amylase: 49 U/L (ref 0–105)

## 2016-01-23 LAB — URINE CULTURE
Colony Count: NO GROWTH
Organism ID, Bacteria: NO GROWTH

## 2016-01-24 DIAGNOSIS — M87051 Idiopathic aseptic necrosis of right femur: Secondary | ICD-10-CM

## 2016-01-24 DIAGNOSIS — M87052 Idiopathic aseptic necrosis of left femur: Secondary | ICD-10-CM

## 2016-01-24 HISTORY — DX: Idiopathic aseptic necrosis of right femur: M87.051

## 2016-01-24 HISTORY — DX: Idiopathic aseptic necrosis of left femur: M87.052

## 2016-01-24 MED ORDER — ASPIRIN EC 81 MG PO TBEC: 81.0000 mg | DELAYED_RELEASE_TABLET | Freq: Every day | ORAL | Status: DC

## 2016-01-24 MED ORDER — ALENDRONATE SODIUM 70 MG PO TABS: 70.0000 mg | ORAL_TABLET | ORAL | Status: DC

## 2016-01-24 NOTE — Addendum Note (Signed)
Addended by: Silverio Decamp on: 01/24/2016 10:25 AM   Modules accepted: Orders

## 2016-01-24 NOTE — Assessment & Plan Note (Signed)
Starting Fosamax and aspirin. There does not appear to be any collapse of the femoral heads, we will simply watch this on the current regimen. I would avoid hip joint injections.

## 2016-01-28 ENCOUNTER — Encounter: Payer: Self-pay | Admitting: Family Medicine

## 2016-01-28 ENCOUNTER — Ambulatory Visit (INDEPENDENT_AMBULATORY_CARE_PROVIDER_SITE_OTHER): Payer: BLUE CROSS/BLUE SHIELD | Admitting: Family Medicine

## 2016-01-28 VITALS — BP 140/85 | HR 77 | Wt 192.0 lb

## 2016-01-28 DIAGNOSIS — R1031 Right lower quadrant pain: Secondary | ICD-10-CM | POA: Diagnosis not present

## 2016-01-28 DIAGNOSIS — M87051 Idiopathic aseptic necrosis of right femur: Secondary | ICD-10-CM

## 2016-01-28 DIAGNOSIS — M87052 Idiopathic aseptic necrosis of left femur: Secondary | ICD-10-CM | POA: Diagnosis not present

## 2016-01-28 MED ORDER — HYDROCODONE-ACETAMINOPHEN 5-325 MG PO TABS: 1.0000 | ORAL_TABLET | Freq: Every evening | ORAL | Status: DC | PRN

## 2016-01-28 MED ORDER — TRAMADOL HCL 50 MG PO TABS: 50.0000 mg | ORAL_TABLET | Freq: Three times a day (TID) | ORAL | Status: DC | PRN

## 2016-01-28 NOTE — Progress Notes (Signed)
   Subjective:    Patient ID: Andrew Frost, male    DOB: 1976-04-13, 40 y.o.   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  He has started on fosamax and ASA.  Says his pain is worse on the left hip.  externallty rotating hips and turning over in bed is severe.  He says his pain can be an 8-9 out of 10 at times. He's not really taking anything for it right now. He does drink alcohol. He says maybe a half a bottle of wine per week usually with evening meal. Some nights he'll have 1 beer. On the weekends he may have for 5 years. He denies any significant history of steroid exposure. He said years ago he took a short course of oral prednisone for his back and then later had an injection in his spine. He has never had hip injections and has not had recurrent courses of prednisone or steroids.  His wife is here for the visit today as well.   He reports his abdominal pain is much better. He still thinks he may have had a mild case of diverticulitis. He completed the antibiotics and is actually feeling much better.  Review of Systems     Objective:   Physical Exam  Constitutional: He is oriented to person, place, and time. He appears well-developed and well-nourished.  HENT:  Head: Normocephalic and atraumatic.  Cardiovascular: Normal rate, regular rhythm and normal heart sounds.   Pulmonary/Chest: Effort normal and breath sounds normal.  Abdominal: Soft. Bowel sounds are normal. He exhibits no distension and no mass. There is no tenderness. There is no rebound and no guarding.  Neurological: He is alert and oriented to person, place, and time.  Skin: Skin is warm and dry.  Psychiatric: He has a normal mood and affect. His behavior is normal.          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 I did encourage him to avoid alcohol. Also encouraged him to avoid any heavy lifting. And not to do  anything significantly weightbearing on the hips. He says he actually doesn't do any heavy lifting at work but he does do a lot of driving. He Effexor just joined the gym about a week ago and I encouraged him not to go. He can certainly work on things like crunches etc. but really needs to avoid any type of weight lifting that is going to put extra pressure on his hip and definitely needs to avoid running or jogging. He did start the Fosamax as well as the aspirin and we discussed that there are some studies that show some potential benefit. Nonsurgical treatments without collapse of the femoral head can be successful about 20% of the time and we reviewed this today. Will need to follow back up with sports medicine or orthopedics to be followed. For his pain will start with a trial of tramadol during the day and hydrocodone as needed at bedtime. Warned about potential for sedation as well as the potential for addiction.  Abdominal pain-resolved. Abdomen nontender on exam today.  I'm spent 25 minutes, more than half the time spent counseling about avascular necrosis and abdominal pain.

## 2016-01-31 ENCOUNTER — Telehealth: Payer: Self-pay | Admitting: Family Medicine

## 2016-01-31 DIAGNOSIS — M87059 Idiopathic aseptic necrosis of unspecified femur: Secondary | ICD-10-CM

## 2016-01-31 MED ORDER — HYDROCODONE-ACETAMINOPHEN 5-325 MG PO TABS: 1.0000 | ORAL_TABLET | Freq: Three times a day (TID) | ORAL | Status: DC | PRN

## 2016-01-31 NOTE — Telephone Encounter (Signed)
Please see if the hydrocodone at bedtime is helping. If it is then we can bump that up to one tab 3 times a day as needed. We will wait and place referral to orthopedics and see if they can get him in this week or early next week.

## 2016-01-31 NOTE — Telephone Encounter (Signed)
Would like updated Rx. Referral is being worked on by our Teaching laboratory technician. Pt wife advised she should hear something from them regarding scheduling by the end of the week.

## 2016-01-31 NOTE — Telephone Encounter (Signed)
Pt wife called to see if Dr. Dianah Field was going to be able to follow this Pt or if an Ortho referral needs to be placed. The pain Rx's given at appt on Friday "aren't helping."

## 2016-01-31 NOTE — Telephone Encounter (Signed)
New rx printed.Andrew Frost

## 2016-02-01 NOTE — Telephone Encounter (Signed)
Pt's wife called and lvm inquiring about the ortho referral. Will fwd to [facility]. For f/u.Audelia Hives [place]

## 2016-02-11 ENCOUNTER — Telehealth: Payer: Self-pay | Admitting: *Deleted

## 2016-02-11 NOTE — Telephone Encounter (Signed)
Pt called and lvm asking about getting a CD to take with him on Monday when he goes to his ortho appt. Called imaging and Junie Panning will get this ready for him. Pt informed.Audelia Hives [place]

## 2016-04-13 ENCOUNTER — Other Ambulatory Visit: Payer: Self-pay

## 2016-04-13 ENCOUNTER — Other Ambulatory Visit: Payer: Self-pay | Admitting: Orthopedic Surgery

## 2016-04-13 MED ORDER — HYDROCODONE-ACETAMINOPHEN 5-325 MG PO TABS: 1.0000 | ORAL_TABLET | Freq: Three times a day (TID) | ORAL | Status: DC | PRN

## 2016-04-13 NOTE — Telephone Encounter (Signed)
Hydrocodone refill. Needs refills until hip replacement.

## 2016-04-25 ENCOUNTER — Ambulatory Visit (INDEPENDENT_AMBULATORY_CARE_PROVIDER_SITE_OTHER): Payer: BLUE CROSS/BLUE SHIELD

## 2016-04-25 ENCOUNTER — Ambulatory Visit (INDEPENDENT_AMBULATORY_CARE_PROVIDER_SITE_OTHER): Payer: BLUE CROSS/BLUE SHIELD | Admitting: Sports Medicine

## 2016-04-25 ENCOUNTER — Encounter: Payer: Self-pay | Admitting: Sports Medicine

## 2016-04-25 VITALS — BP 124/84 | HR 86 | Resp 18 | Wt 194.4 lb

## 2016-04-25 DIAGNOSIS — R0781 Pleurodynia: Secondary | ICD-10-CM | POA: Insufficient documentation

## 2016-04-25 DIAGNOSIS — R0789 Other chest pain: Secondary | ICD-10-CM | POA: Insufficient documentation

## 2016-04-25 MED ORDER — OXYCODONE-ACETAMINOPHEN 10-325 MG PO TABS: 1.0000 | ORAL_TABLET | Freq: Three times a day (TID) | ORAL | Status: DC | PRN

## 2016-04-25 NOTE — Progress Notes (Signed)
   Subjective:    I'm seeing this patient as a consultation for:  Dr. Beatrice Lecher  CC: Right-sided chest pain  HPI: This is a pleasant 40 year old male, several days ago he fell in the bathtub impacting his right lateral chest wall. He had immediate pain, swelling, bruising. He was referred to me for further evaluation and definitive treatment, no coughing, no hemoptysis, no shortness of breath, simple splinting when he tries to take a deep breath.  Past medical history, Surgical history, Family history not pertinant except as noted below, Social history, Allergies, and medications have been entered into the medical record, reviewed, and no changes needed.   Review of Systems: No headache, visual changes, nausea, vomiting, diarrhea, constipation, dizziness, abdominal pain, skin rash, fevers, chills, night sweats, weight loss, swollen lymph nodes, body aches, joint swelling, muscle aches, chest pain, shortness of breath, mood changes, visual or auditory hallucinations.   Objective:   General: Well Developed, well nourished, and in no acute distress.  Neuro/Psych: Alert and oriented x3, extra-ocular muscles intact, able to move all 4 extremities, sensation grossly intact. Skin: Warm and dry, no rashes noted.  Respiratory: Not using accessory muscles, speaking in full sentences, trachea midline. Tenderness along the right ribs midaxillary. Nipple line. No crepitus. Cardiovascular: Pulses palpable, no extremity edema. Abdomen: Does not appear distended.  Impression and Recommendations:   This case required medical decision making of moderate complexity.

## 2016-04-25 NOTE — Assessment & Plan Note (Signed)
Chest was strapped with compressive dressing, Percocet for pain. Rib series on the right. Return in 4 weeks.

## 2016-05-05 ENCOUNTER — Other Ambulatory Visit: Payer: Self-pay | Admitting: Orthopedic Surgery

## 2016-05-23 ENCOUNTER — Ambulatory Visit: Payer: BLUE CROSS/BLUE SHIELD | Admitting: Sports Medicine

## 2016-05-24 NOTE — Pre-Procedure Instructions (Signed)
Andrew Frost  05/24/2016      [id number] - [place] - [address] [place] [address] Phone: [phone] Fax: [phone]    Your procedure is scheduled on   Monday  06/05/16  Report to [facility] Admitting at 530 A.M.  Call this number if you have problems the morning of surgery:  [phone]   Remember:  Do not eat food or drink liquids after midnight.  Take these medicines the morning of surgery with A SIP OF WATER   OXYCODONE  IF NEEDED      (STOP ASPIRIN, MELOXICAM /MOBIC ,MULTIVITAMIN, PHENTERMINE, IBUPROFEN/ ADVIL/ MOTRIN, GOODY POWDERS ,BC'S, VITAMINS ,HERBAL MEDICINES)   Do not wear jewelry, make-up or nail polish.  Do not wear lotions, powders, or perfumes.  You may wear deoderant.  Do not shave 48 hours prior to surgery.  Men may shave face and neck.  Do not bring valuables to the hospital.  [facility] is not responsible for any belongings or valuables.  Contacts, dentures or bridgework may not be worn into surgery.  Leave your suitcase in the car.  After surgery it may be brought to your room.  For patients admitted to the hospital, discharge time will be determined by your treatment team.  Patients discharged the day of surgery will not be allowed to drive home.   Name and phone number of your driver:   Special instructions:  [facility] - Preparing for Surgery  Before surgery, you can play an important role.  Because skin is not sterile, your skin needs to be as free of germs as possible.  You can reduce the number of germs on you skin by washing with CHG (chlorahexidine gluconate) soap before surgery.  CHG is an antiseptic cleaner which kills germs and bonds with the skin to continue killing germs even after washing.  Please DO NOT use if you have an allergy to CHG or antibacterial soaps.  If your skin becomes reddened/irritated stop using the CHG and inform your nurse when you arrive at Short  Stay.  Do not shave (including legs and underarms) for at least 48 hours prior to the first CHG shower.  You may shave your face.  Please follow these instructions carefully:   1.  Shower with CHG Soap the night before surgery and the                                morning of Surgery.  2.  If you choose to wash your hair, wash your hair first as usual with your       normal shampoo.  3.  After you shampoo, rinse your hair and body thoroughly to remove the                      Shampoo.  4.  Use CHG as you would any other liquid soap.  You can apply chg directly       to the skin and wash gently with scrungie or a clean washcloth.  5.  Apply the CHG Soap to your body ONLY FROM THE NECK DOWN.        Do not use on open wounds or open sores.  Avoid contact with your eyes,       ears, mouth and genitals (private parts).  Wash genitals (private parts)  with your normal soap.  6.  Wash thoroughly, paying special attention to the area where your surgery        will be performed.  7.  Thoroughly rinse your body with warm water from the neck down.  8.  DO NOT shower/wash with your normal soap after using and rinsing off       the CHG Soap.  9.  Pat yourself dry with a clean towel.            10.  Wear clean pajamas.            11.  Place clean sheets on your bed the night of your first shower and do not        sleep with pets.  Day of Surgery  Do not apply any lotions/deoderants the morning of surgery.  Please wear clean clothes to the hospital/surgery center.    Please read over the following fact sheets that you were given. Pain Booklet, Coughing and Deep Breathing, Blood Transfusion Information, MRSA Information and Surgical Site Infection Prevention

## 2016-05-25 ENCOUNTER — Encounter (HOSPITAL_COMMUNITY)
Admission: RE | Admit: 2016-05-25 | Discharge: 2016-05-25 | Disposition: A | Discharge status: Home / Self Care | Payer: BLUE CROSS/BLUE SHIELD | Source: Ambulatory Visit | Attending: Orthopedic Surgery | Admitting: Orthopedic Surgery

## 2016-05-25 ENCOUNTER — Ambulatory Visit (HOSPITAL_COMMUNITY)
Admission: RE | Admit: 2016-05-25 | Discharge: 2016-05-25 | Disposition: A | Discharge status: Home / Self Care | Payer: BLUE CROSS/BLUE SHIELD | Source: Ambulatory Visit | Attending: Orthopedic Surgery | Admitting: Orthopedic Surgery

## 2016-05-25 ENCOUNTER — Encounter (HOSPITAL_COMMUNITY): Payer: Self-pay

## 2016-05-25 ENCOUNTER — Other Ambulatory Visit: Payer: Self-pay

## 2016-05-25 DIAGNOSIS — Z01810 Encounter for preprocedural cardiovascular examination: Secondary | ICD-10-CM | POA: Insufficient documentation

## 2016-05-25 DIAGNOSIS — Z01818 Encounter for other preprocedural examination: Secondary | ICD-10-CM | POA: Insufficient documentation

## 2016-05-25 DIAGNOSIS — Z01812 Encounter for preprocedural laboratory examination: Secondary | ICD-10-CM | POA: Diagnosis not present

## 2016-05-25 HISTORY — DX: Calculus of prostate: N42.0

## 2016-05-25 HISTORY — DX: Diverticulitis of intestine, part unspecified, without perforation or abscess without bleeding: K57.92

## 2016-05-25 LAB — CBC WITH DIFFERENTIAL/PLATELET
Basophils Absolute: 0 10*3/uL (ref 0.0–0.1)
Basophils Relative: 0 %
EOS ABS: 0.1 10*3/uL (ref 0.0–0.7)
Eosinophils Relative: 2 %
HEMATOCRIT: 41.4 % (ref 39.0–52.0)
HEMOGLOBIN: 13.4 g/dL (ref 13.0–17.0)
LYMPHS ABS: 1.1 10*3/uL (ref 0.7–4.0)
LYMPHS PCT: 26 %
MCH: 29.2 pg (ref 26.0–34.0)
MCHC: 32.4 g/dL (ref 30.0–36.0)
MCV: 90.2 fL (ref 78.0–100.0)
MONOS PCT: 8 %
Monocytes Absolute: 0.3 10*3/uL (ref 0.1–1.0)
NEUTROS ABS: 2.6 10*3/uL (ref 1.7–7.7)
NEUTROS PCT: 64 %
Platelets: 177 10*3/uL (ref 150–400)
RBC: 4.59 MIL/uL (ref 4.22–5.81)
RDW: 14.1 % (ref 11.5–15.5)
WBC: 4.1 10*3/uL (ref 4.0–10.5)

## 2016-05-25 LAB — BASIC METABOLIC PANEL
Anion gap: 9 (ref 5–15)
BUN: 16 mg/dL (ref 6–20)
CHLORIDE: 105 mmol/L (ref 101–111)
CO2: 25 mmol/L (ref 22–32)
CREATININE: 1.04 mg/dL (ref 0.61–1.24)
Calcium: 9.1 mg/dL (ref 8.9–10.3)
GFR calc non Af Amer: >60 mL/min (ref >60)
Glucose, Bld: 108 mg/dL — ABNORMAL HIGH (ref 65–99)
POTASSIUM: 4.3 mmol/L (ref 3.5–5.1)
Sodium: 139 mmol/L (ref 135–145)

## 2016-05-25 LAB — URINALYSIS, ROUTINE W REFLEX MICROSCOPIC
Bilirubin Urine: NEGATIVE
GLUCOSE, UA: NEGATIVE mg/dL
HGB URINE DIPSTICK: NEGATIVE
Ketones, ur: NEGATIVE mg/dL
Leukocytes, UA: NEGATIVE
Nitrite: NEGATIVE
PH: 7 (ref 5.0–8.0)
PROTEIN: NEGATIVE mg/dL
SPECIFIC GRAVITY, URINE: 1.025 (ref 1.005–1.030)

## 2016-05-25 LAB — TYPE AND SCREEN
ABO/RH(D): O POS
Antibody Screen: NEGATIVE

## 2016-05-25 LAB — SURGICAL PCR SCREEN
MRSA, PCR: NEGATIVE
Staphylococcus aureus: POSITIVE — AB

## 2016-05-25 LAB — ABO/RH: ABO/RH(D): O POS

## 2016-05-25 LAB — PROTIME-INR
INR: 0.93 (ref 0.00–1.49)
Prothrombin Time: 12.7 seconds (ref 11.6–15.2)

## 2016-05-25 LAB — APTT: aPTT: 29 seconds (ref 24–37)

## 2016-06-02 MED ORDER — DEXTROSE 5 % IV SOLN
3.0000 g | INTRAVENOUS | Status: AC
  Administered 2016-06-05: 3 g via INTRAVENOUS
  Filled 2016-06-02: qty 3000

## 2016-06-02 NOTE — H&P (Signed)
TOTAL HIP ADMISSION H&P  Patient is admitted for left total hip arthroplasty.  Subjective:  Chief Complaint: left hip pain  HPI: Andrew Frost, 40 y.o. male, has a history of pain and functional disability in the left hip(s) due to AVN and patient has failed non-surgical conservative treatments for greater than 12 weeks to include NSAID's and/or analgesics, corticosteriod injections, flexibility and strengthening excercises, supervised PT with diminished ADL's post treatment and activity modification.  Onset of symptoms was abrupt starting 1 years ago with rapidlly worsening course since that time.The patient noted no past surgery on the left hip(s).  Patient currently rates pain in the left hip at 10 out of 10 with activity. Patient has night pain, worsening of pain with activity and weight bearing, pain that interfers with activities of daily living, pain with passive range of motion and crepitus. Patient has evidence of AVN by imaging studies. This condition presents safety issues increasing the risk of falls. This patient has had AVN.  There is no current active infection.  Patient Active Problem List   Diagnosis Date Noted  . Rib pain on right side 04/25/2016  . Avascular necrosis of bones of both hips ([place]) 01/24/2016  . Acute abdominal pain in right lower quadrant 01/21/2016  . Hyperlipidemia 04/26/2015   Past Medical History  Diagnosis Date  . Prostate calculus   . Diverticulitis     Past Surgical History  Procedure Laterality Date  . Vasectomy  12/2008  . Lumbar spine surgery  20012    disc issues.     No prescriptions prior to admission   No Known Allergies  Social History  Substance Use Topics  . Smoking status: Former Research scientist (life sciences)  . Smokeless tobacco: Former Systems developer  . Alcohol Use: 0.0 oz/week    3-6 Standard drinks or equivalent per week     Comment: OCC    BEER    Family History  Problem Relation Age of Onset  . Alcohol abuse      parents  . Depression      parents   . Hypertension      grandparents  . Hyperlipidemia      grandparents  . Heart disease       Review of Systems  Constitutional: Negative.   HENT: Negative.   Eyes: Negative.   Respiratory: Negative.   Cardiovascular: Negative.   Gastrointestinal: Negative.   Genitourinary: Negative.   Musculoskeletal: Positive for joint pain.  Skin: Negative.   Neurological: Negative.   Endo/Heme/Allergies: Negative.   Psychiatric/Behavioral: Negative.     Objective:  Physical Exam  Constitutional: He is oriented to person, place, and time. He appears well-developed and well-nourished.  HENT:  Head: Normocephalic and atraumatic.  Eyes: Pupils are equal, round, and reactive to light.  Neck: Normal range of motion. Neck supple.  Cardiovascular: Intact distal pulses.   Respiratory: Effort normal.  Musculoskeletal: He exhibits tenderness.  the patient does have irritability with internal/external rotation of the left hip.  Mild pain with he'll bump.  He also has mild pain with internal/external rotation of the right hip.  His calves are soft and nontender.  He is neurovascularly intact distally.  Neurological: He is alert and oriented to person, place, and time.  Skin: Skin is warm and dry.  Psychiatric: He has a normal mood and affect. His behavior is normal. Judgment and thought content normal.    Vital signs in last 24 hours:    Labs:   Estimated body mass index is 27.55  kg/(m^2) as calculated from the following:   Height as of 03/29/15: 5\' 10"  (1.778 m).   Weight as of 01/28/16: 87.091 kg (192 lb).   Imaging Review Plain radiographs demonstrate  AP of the pelvis and a crosstable lateral of the left hip are taken and reviewed in office today.  Patient's joint height appears be maintained though he does have signs of avascular necrosis and bilateral hips.  Assessment/Plan:  End stage arthritis, left hip(s)  The patient history, physical examination, clinical judgement of the  provider and imaging studies are consistent with end stage degenerative joint disease of the left hip(s) and total hip arthroplasty is deemed medically necessary. The treatment options including medical management, injection therapy, arthroscopy and arthroplasty were discussed at length. The risks and benefits of total hip arthroplasty were presented and reviewed. The risks due to aseptic loosening, infection, stiffness, dislocation/subluxation,  thromboembolic complications and other imponderables were discussed.  The patient acknowledged the explanation, agreed to proceed with the plan and consent was signed. Patient is being admitted for inpatient treatment for surgery, pain control, PT, OT, prophylactic antibiotics, VTE prophylaxis, progressive ambulation and ADL's and discharge planning.The patient is planning to be discharged home with home health services

## 2016-06-04 DIAGNOSIS — M87052 Idiopathic aseptic necrosis of left femur: Secondary | ICD-10-CM | POA: Diagnosis present

## 2016-06-04 HISTORY — DX: Idiopathic aseptic necrosis of left femur: M87.052

## 2016-06-04 NOTE — Anesthesia Preprocedure Evaluation (Addendum)
Anesthesia Evaluation  Patient identified by MRN, date of birth, ID band Patient awake    Reviewed: Allergy & Precautions, H&P , Patient's Chart, lab work & pertinent test results  Airway Mallampati: II  TM Distance: >3 FB Neck ROM: full    Dental no notable dental hx.    Pulmonary former smoker,    Pulmonary exam normal breath sounds clear to auscultation       Cardiovascular Exercise Tolerance: Good  Rhythm:regular Rate:Normal     Neuro/Psych    GI/Hepatic   Endo/Other    Renal/GU      Musculoskeletal   Abdominal   Peds  Hematology   Anesthesia Other Findings   Reproductive/Obstetrics                             Anesthesia Physical Anesthesia Plan  ASA: II  Anesthesia Plan: Spinal   Post-op Pain Management:    Induction:   Airway Management Planned:   Additional Equipment:   Intra-op Plan:   Post-operative Plan:   Informed Consent: I have reviewed the patients History and Physical, chart, labs and discussed the procedure including the risks, benefits and alternatives for the proposed anesthesia with the patient or authorized representative who has indicated his/her understanding and acceptance.   Dental Advisory Given  Plan Discussed with: CRNA  Anesthesia Plan Comments: (Lab work confirmed with CRNA in room. Platelets okay. Discussed spinal anesthetic, and patient consents to the procedure:  included risk of possible headache,backache, failed block, allergic reaction, and nerve injury. This patient was asked if she had any questions or concerns before the procedure started. )        Anesthesia Quick Evaluation

## 2016-06-05 ENCOUNTER — Encounter (HOSPITAL_COMMUNITY)
Admission: RE | Disposition: A | Discharge status: Home / Self Care | Payer: Self-pay | Source: Ambulatory Visit | Attending: Orthopedic Surgery

## 2016-06-05 ENCOUNTER — Encounter (HOSPITAL_COMMUNITY): Payer: Self-pay | Admitting: *Deleted

## 2016-06-05 ENCOUNTER — Inpatient Hospital Stay (HOSPITAL_COMMUNITY): Payer: BLUE CROSS/BLUE SHIELD

## 2016-06-05 ENCOUNTER — Inpatient Hospital Stay (HOSPITAL_COMMUNITY)
Admission: RE | Admit: 2016-06-05 | Discharge: 2016-06-06 | DRG: 470 | Disposition: A | Discharge status: Home / Self Care | Payer: BLUE CROSS/BLUE SHIELD | Source: Ambulatory Visit | Attending: Orthopedic Surgery | Admitting: Orthopedic Surgery

## 2016-06-05 ENCOUNTER — Inpatient Hospital Stay (HOSPITAL_COMMUNITY): Payer: BLUE CROSS/BLUE SHIELD | Admitting: Anesthesiology

## 2016-06-05 DIAGNOSIS — E785 Hyperlipidemia, unspecified: Secondary | ICD-10-CM | POA: Diagnosis present

## 2016-06-05 DIAGNOSIS — M87059 Idiopathic aseptic necrosis of unspecified femur: Secondary | ICD-10-CM | POA: Diagnosis present

## 2016-06-05 DIAGNOSIS — M87052 Idiopathic aseptic necrosis of left femur: Secondary | ICD-10-CM

## 2016-06-05 DIAGNOSIS — D62 Acute posthemorrhagic anemia: Secondary | ICD-10-CM | POA: Diagnosis not present

## 2016-06-05 DIAGNOSIS — R0781 Pleurodynia: Secondary | ICD-10-CM

## 2016-06-05 DIAGNOSIS — M87852 Other osteonecrosis, left femur: Secondary | ICD-10-CM | POA: Diagnosis present

## 2016-06-05 DIAGNOSIS — M1612 Unilateral primary osteoarthritis, left hip: Secondary | ICD-10-CM | POA: Diagnosis present

## 2016-06-05 DIAGNOSIS — M25552 Pain in left hip: Secondary | ICD-10-CM | POA: Diagnosis present

## 2016-06-05 DIAGNOSIS — M87851 Other osteonecrosis, right femur: Secondary | ICD-10-CM | POA: Diagnosis present

## 2016-06-05 DIAGNOSIS — Z87891 Personal history of nicotine dependence: Secondary | ICD-10-CM

## 2016-06-05 DIAGNOSIS — Z419 Encounter for procedure for purposes other than remedying health state, unspecified: Secondary | ICD-10-CM

## 2016-06-05 HISTORY — PX: TOTAL HIP ARTHROPLASTY: SHX124

## 2016-06-05 SURGERY — ARTHROPLASTY, HIP, TOTAL, ANTERIOR APPROACH
Anesthesia: Spinal | Laterality: Left

## 2016-06-05 MED ORDER — METHOCARBAMOL 500 MG PO TABS
500.0000 mg | ORAL_TABLET | Freq: Four times a day (QID) | ORAL | Status: DC | PRN
  Administered 2016-06-05 – 2016-06-06 (×5): 500 mg via ORAL
  Filled 2016-06-05 (×5): qty 1

## 2016-06-05 MED ORDER — ACETAMINOPHEN 650 MG RE SUPP: 650.0000 mg | Freq: Four times a day (QID) | RECTAL | Status: DC | PRN

## 2016-06-05 MED ORDER — KCL IN DEXTROSE-NACL 20-5-0.45 MEQ/L-%-% IV SOLN
INTRAVENOUS | Status: DC
  Administered 2016-06-05 (×2): via INTRAVENOUS
  Filled 2016-06-05 (×2): qty 1000

## 2016-06-05 MED ORDER — BUPIVACAINE HCL (PF) 0.25 % IJ SOLN
INTRAMUSCULAR | Status: DC | PRN
  Administered 2016-06-05: 50 mL

## 2016-06-05 MED ORDER — ASPIRIN EC 325 MG PO TBEC
325.0000 mg | DELAYED_RELEASE_TABLET | Freq: Every day | ORAL | Status: DC
  Administered 2016-06-06: 325 mg via ORAL
  Filled 2016-06-05: qty 1

## 2016-06-05 MED ORDER — PHENOL 1.4 % MT LIQD: 1.0000 | OROMUCOSAL | Status: DC | PRN

## 2016-06-05 MED ORDER — ALUMINUM HYDROXIDE GEL 320 MG/5ML PO SUSP
15.0000 mL | ORAL | Status: DC | PRN
  Filled 2016-06-05: qty 30

## 2016-06-05 MED ORDER — BUPIVACAINE HCL (PF) 0.5 % IJ SOLN
INTRAMUSCULAR | Status: AC
  Filled 2016-06-05: qty 30

## 2016-06-05 MED ORDER — POLYETHYLENE GLYCOL 3350 17 G PO PACK: 17.0000 g | PACK | Freq: Every day | ORAL | Status: DC | PRN

## 2016-06-05 MED ORDER — ONDANSETRON HCL 4 MG/2ML IJ SOLN
INTRAMUSCULAR | Status: DC | PRN
Start: 2016-06-05 — End: 2016-06-05
  Administered 2016-06-05: 4 mg via INTRAVENOUS

## 2016-06-05 MED ORDER — CELECOXIB 200 MG PO CAPS
200.0000 mg | ORAL_CAPSULE | Freq: Two times a day (BID) | ORAL | Status: DC
  Administered 2016-06-05 – 2016-06-06 (×2): 200 mg via ORAL
  Filled 2016-06-05 (×3): qty 1

## 2016-06-05 MED ORDER — BUPIVACAINE LIPOSOME 1.3 % IJ SUSP
20.0000 mL | Freq: Once | INTRAMUSCULAR | Status: AC
  Administered 2016-06-05: 20 mL
  Filled 2016-06-05 (×2): qty 20

## 2016-06-05 MED ORDER — DOCUSATE SODIUM 100 MG PO CAPS
100.0000 mg | ORAL_CAPSULE | Freq: Two times a day (BID) | ORAL | Status: DC
  Administered 2016-06-05 – 2016-06-06 (×3): 100 mg via ORAL
  Filled 2016-06-05 (×3): qty 1

## 2016-06-05 MED ORDER — HYDROMORPHONE HCL 1 MG/ML IJ SOLN: 0.2500 mg | INTRAMUSCULAR | Status: DC | PRN

## 2016-06-05 MED ORDER — OXYCODONE-ACETAMINOPHEN 10-325 MG PO TABS
1.0000 | ORAL_TABLET | Freq: Four times a day (QID) | ORAL | Status: DC | PRN
Start: 2016-06-05 — End: 2016-09-29

## 2016-06-05 MED ORDER — METOCLOPRAMIDE HCL 5 MG PO TABS
5.0000 mg | ORAL_TABLET | Freq: Three times a day (TID) | ORAL | Status: DC | PRN
Start: 2016-06-05 — End: 2016-06-06

## 2016-06-05 MED ORDER — DEXTROSE 5 % IV SOLN
500.0000 mg | Freq: Four times a day (QID) | INTRAVENOUS | Status: DC | PRN
  Filled 2016-06-05: qty 5

## 2016-06-05 MED ORDER — PHENTERMINE HCL 37.5 MG PO TABS: 18.7500 mg | ORAL_TABLET | Freq: Every day | ORAL | Status: DC

## 2016-06-05 MED ORDER — SODIUM CHLORIDE 0.9 % IV SOLN
INTRAVENOUS | Status: DC | PRN
  Administered 2016-06-05: .0001 mL

## 2016-06-05 MED ORDER — TRANEXAMIC ACID 1000 MG/10ML IV SOLN
1000.0000 mg | INTRAVENOUS | Status: AC
  Administered 2016-06-05: 1000 mg via INTRAVENOUS
  Filled 2016-06-05 (×2): qty 10

## 2016-06-05 MED ORDER — ONDANSETRON HCL 4 MG/2ML IJ SOLN
4.0000 mg | Freq: Four times a day (QID) | INTRAMUSCULAR | Status: DC | PRN
  Administered 2016-06-05: 4 mg via INTRAVENOUS
  Filled 2016-06-05: qty 2

## 2016-06-05 MED ORDER — 0.9 % SODIUM CHLORIDE (POUR BTL) OPTIME
TOPICAL | Status: DC | PRN
  Administered 2016-06-05: 1000 mL

## 2016-06-05 MED ORDER — DIPHENHYDRAMINE HCL 12.5 MG/5ML PO ELIX: 12.5000 mg | ORAL_SOLUTION | ORAL | Status: DC | PRN

## 2016-06-05 MED ORDER — ACETAMINOPHEN 160 MG/5ML PO SOLN
975.0000 mg | Freq: Once | ORAL | Status: AC
  Administered 2016-06-05: 975 mg via ORAL
  Filled 2016-06-05: qty 40

## 2016-06-05 MED ORDER — HYDROMORPHONE HCL 1 MG/ML IJ SOLN
1.0000 mg | INTRAMUSCULAR | Status: DC | PRN
  Administered 2016-06-05 – 2016-06-06 (×11): 1 mg via INTRAVENOUS
  Filled 2016-06-05 (×11): qty 1

## 2016-06-05 MED ORDER — PROPOFOL 500 MG/50ML IV EMUL
INTRAVENOUS | Status: DC | PRN
  Administered 2016-06-05: 100 ug/kg/min via INTRAVENOUS

## 2016-06-05 MED ORDER — ONDANSETRON HCL 4 MG/2ML IJ SOLN
INTRAMUSCULAR | Status: AC
  Filled 2016-06-05: qty 2

## 2016-06-05 MED ORDER — TRANEXAMIC ACID 1000 MG/10ML IV SOLN
2000.0000 mg | Freq: Once | INTRAVENOUS | Status: AC
  Administered 2016-06-05: 2000 mg via TOPICAL
  Filled 2016-06-05 (×3): qty 20

## 2016-06-05 MED ORDER — ONDANSETRON HCL 4 MG PO TABS: 4.0000 mg | ORAL_TABLET | Freq: Four times a day (QID) | ORAL | Status: DC | PRN

## 2016-06-05 MED ORDER — CHLORHEXIDINE GLUCONATE 4 % EX LIQD: 60.0000 mL | Freq: Once | CUTANEOUS | Status: DC

## 2016-06-05 MED ORDER — LACTATED RINGERS IV SOLN
INTRAVENOUS | Status: DC | PRN
  Administered 2016-06-05 (×2): via INTRAVENOUS

## 2016-06-05 MED ORDER — ACETAMINOPHEN 325 MG PO TABS
650.0000 mg | ORAL_TABLET | Freq: Four times a day (QID) | ORAL | Status: DC | PRN
  Administered 2016-06-05 – 2016-06-06 (×3): 650 mg via ORAL
  Filled 2016-06-05 (×3): qty 2

## 2016-06-05 MED ORDER — OXYCODONE HCL 5 MG PO TABS
5.0000 mg | ORAL_TABLET | ORAL | Status: DC | PRN
  Administered 2016-06-05 – 2016-06-06 (×8): 10 mg via ORAL
  Filled 2016-06-05 (×8): qty 2

## 2016-06-05 MED ORDER — MENTHOL 3 MG MT LOZG: 1.0000 | LOZENGE | OROMUCOSAL | Status: DC | PRN

## 2016-06-05 MED ORDER — FENTANYL CITRATE (PF) 250 MCG/5ML IJ SOLN
INTRAMUSCULAR | Status: AC
  Filled 2016-06-05: qty 5

## 2016-06-05 MED ORDER — PROPOFOL 10 MG/ML IV BOLUS
INTRAVENOUS | Status: AC
  Filled 2016-06-05: qty 20

## 2016-06-05 MED ORDER — BUPIVACAINE HCL (PF) 0.5 % IJ SOLN
INTRAMUSCULAR | Status: DC | PRN
  Administered 2016-06-05: .00001 mL

## 2016-06-05 MED ORDER — BUPIVACAINE HCL (PF) 0.25 % IJ SOLN
INTRAMUSCULAR | Status: AC
  Filled 2016-06-05: qty 30

## 2016-06-05 MED ORDER — MIDAZOLAM HCL 5 MG/5ML IJ SOLN
INTRAMUSCULAR | Status: DC | PRN
  Administered 2016-06-05: 2 mg via INTRAVENOUS

## 2016-06-05 MED ORDER — MIDAZOLAM HCL 2 MG/2ML IJ SOLN
INTRAMUSCULAR | Status: AC
  Filled 2016-06-05: qty 2

## 2016-06-05 MED ORDER — FLEET ENEMA 7-19 GM/118ML RE ENEM: 1.0000 | ENEMA | Freq: Once | RECTAL | Status: DC | PRN

## 2016-06-05 MED ORDER — METOCLOPRAMIDE HCL 5 MG/ML IJ SOLN: 5.0000 mg | Freq: Three times a day (TID) | INTRAMUSCULAR | Status: DC | PRN

## 2016-06-05 MED ORDER — BISACODYL 5 MG PO TBEC: 5.0000 mg | DELAYED_RELEASE_TABLET | Freq: Every day | ORAL | Status: DC | PRN

## 2016-06-05 MED ORDER — PROPOFOL 1000 MG/100ML IV EMUL
INTRAVENOUS | Status: AC
  Filled 2016-06-05: qty 200

## 2016-06-05 MED ORDER — DEXTROSE-NACL 5-0.45 % IV SOLN: INTRAVENOUS | Status: DC

## 2016-06-05 MED ORDER — ASPIRIN EC 325 MG PO TBEC: 325.0000 mg | DELAYED_RELEASE_TABLET | Freq: Two times a day (BID) | ORAL | Status: DC

## 2016-06-05 MED ORDER — DEXAMETHASONE SODIUM PHOSPHATE 10 MG/ML IJ SOLN
10.0000 mg | Freq: Once | INTRAMUSCULAR | Status: AC
  Administered 2016-06-06: 10 mg via INTRAVENOUS
  Filled 2016-06-05: qty 1

## 2016-06-05 MED ORDER — FENTANYL CITRATE (PF) 100 MCG/2ML IJ SOLN
INTRAMUSCULAR | Status: DC | PRN
  Administered 2016-06-05: 100 ug via INTRAVENOUS

## 2016-06-05 MED ORDER — METHOCARBAMOL 500 MG PO TABS: 500.0000 mg | ORAL_TABLET | Freq: Two times a day (BID) | ORAL | Status: DC

## 2016-06-05 SURGICAL SUPPLY — 45 items
BLADE SURG ROTATE 9660 (MISCELLANEOUS) IMPLANT
CAPT HIP TOTAL 2 ×3 IMPLANT
COVER PERINEAL POST (MISCELLANEOUS) ×3 IMPLANT
COVER SURGICAL LIGHT HANDLE (MISCELLANEOUS) ×3 IMPLANT
DRAPE C-ARM 42X72 X-RAY (DRAPES) ×3 IMPLANT
DRAPE STERI IOBAN 125X83 (DRAPES) ×3 IMPLANT
DRAPE U-SHAPE 47X51 STRL (DRAPES) ×6 IMPLANT
DRSG AQUACEL AG ADV 3.5X10 (GAUZE/BANDAGES/DRESSINGS) ×3 IMPLANT
DURAPREP 26ML APPLICATOR (WOUND CARE) ×3 IMPLANT
ELECT BLADE 4.0 EZ CLEAN MEGAD (MISCELLANEOUS) ×3
ELECT REM PT RETURN 9FT ADLT (ELECTROSURGICAL) ×3
ELECTRODE BLDE 4.0 EZ CLN MEGD (MISCELLANEOUS) ×1 IMPLANT
ELECTRODE REM PT RTRN 9FT ADLT (ELECTROSURGICAL) ×1 IMPLANT
FACESHIELD WRAPAROUND (MASK) ×6 IMPLANT
GLOVE BIO SURGEON STRL SZ7.5 (GLOVE) ×3 IMPLANT
GLOVE BIO SURGEON STRL SZ8.5 (GLOVE) ×6 IMPLANT
GLOVE BIOGEL PI IND STRL 8 (GLOVE) ×2 IMPLANT
GLOVE BIOGEL PI IND STRL 9 (GLOVE) ×1 IMPLANT
GLOVE BIOGEL PI INDICATOR 8 (GLOVE) ×4
GLOVE BIOGEL PI INDICATOR 9 (GLOVE) ×2
GOWN STRL REUS W/ TWL LRG LVL3 (GOWN DISPOSABLE) ×1 IMPLANT
GOWN STRL REUS W/ TWL XL LVL3 (GOWN DISPOSABLE) ×2 IMPLANT
GOWN STRL REUS W/TWL LRG LVL3 (GOWN DISPOSABLE) ×2
GOWN STRL REUS W/TWL XL LVL3 (GOWN DISPOSABLE) ×4
KIT BASIN OR (CUSTOM PROCEDURE TRAY) ×3 IMPLANT
KIT ROOM TURNOVER OR (KITS) ×3 IMPLANT
MANIFOLD NEPTUNE II (INSTRUMENTS) ×3 IMPLANT
NS IRRIG 1000ML POUR BTL (IV SOLUTION) ×3 IMPLANT
PACK TOTAL JOINT (CUSTOM PROCEDURE TRAY) ×3 IMPLANT
PAD ARMBOARD 7.5X6 YLW CONV (MISCELLANEOUS) ×6 IMPLANT
SAW OSC TIP CART 19.5X105X1.3 (SAW) ×3 IMPLANT
SUT ETHIBOND NAB CT1 #1 30IN (SUTURE) ×6 IMPLANT
SUT VIC AB 0 CTX 36 (SUTURE) ×2
SUT VIC AB 0 CTX36XBRD ANTBCTR (SUTURE) ×1 IMPLANT
SUT VIC AB 1 CTX 36 (SUTURE) ×2
SUT VIC AB 1 CTX36XBRD ANBCTR (SUTURE) ×1 IMPLANT
SUT VIC AB 2-0 CT1 27 (SUTURE) ×2
SUT VIC AB 2-0 CT1 TAPERPNT 27 (SUTURE) ×1 IMPLANT
SUT VIC AB 2-0 CTX 27 (SUTURE) ×3 IMPLANT
SUT VIC AB 3-0 PS2 18 (SUTURE) ×2
SUT VIC AB 3-0 PS2 18XBRD (SUTURE) ×1 IMPLANT
TOWEL OR 17X24 6PK STRL BLUE (TOWEL DISPOSABLE) ×3 IMPLANT
TOWEL OR 17X26 10 PK STRL BLUE (TOWEL DISPOSABLE) ×3 IMPLANT
TRAY FOLEY CATH 14FR (SET/KITS/TRAYS/PACK) IMPLANT
WATER STERILE IRR 1000ML POUR (IV SOLUTION) ×3 IMPLANT

## 2016-06-05 NOTE — Anesthesia Procedure Notes (Signed)
Spinal Patient location during procedure: OR Preanesthetic Checklist Completed: patient identified, site marked, surgical consent, pre-op evaluation, timeout performed, IV checked, risks and benefits discussed and monitors and equipment checked Spinal Block Patient position: sitting Prep: DuraPrep Patient monitoring: heart rate, cardiac monitor, continuous pulse ox and blood pressure Approach: midline Location: L3-4 Injection technique: single-shot Needle Needle type: Sprotte  Needle gauge: 24 G Needle length: 9 cm Assessment Sensory level: T4 Additional Notes Spinal Dosage in OR  Bupivicaine ml       1.9 LLD x 3 min

## 2016-06-05 NOTE — Discharge Instructions (Signed)

## 2016-06-05 NOTE — Op Note (Signed)
OPERATIVE REPORT    DATE OF PROCEDURE:  06/05/2016       PREOPERATIVE DIAGNOSIS:  LEFT HIP AVASCULAR NECROSIS                                                          POSTOPERATIVE DIAGNOSIS:  LEFT HIP AVASCULAR NECROSIS                                                           PROCEDURE: Anterior L total hip arthroplasty using a 54 mm DePuy Pinnacle  Cup, [employer], 0-degree polyethylene liner, a +1.5 36 mm ceramic head, a 5 Depuy Triloc stem   SURGEON: Destan Franchini J    ASSISTANT:   [employer]  (present throughout entire procedure and necessary for timely completion of the procedure)   ANESTHESIA: Spinal BLOOD LOSS: 300 FLUID REPLACEMENT: 1500 crystalloid Antibiotic: 2gm ancef Tranexamic Acid: 1gm iv 2gm topical COMPLICATIONS: none    INDICATIONS FOR PROCEDURE: A 40 y.o. year-old With  [place]   for 2 years, x-rays show bone-on-bone arthritic changes, and osteophytes. Despite conservative measures with observation, anti-inflammatory medicine, narcotics, use of a cane, has severe unremitting pain and can ambulate only a few blocks before resting. Patient desires elective L total hip arthroplasty to decrease pain and increase function. The risks, benefits, and alternatives were discussed at length including but not limited to the risks of infection, bleeding, nerve injury, stiffness, blood clots, the need for revision surgery, cardiopulmonary complications, among others, and they were willing to proceed. Questions answered     PROCEDURE IN DETAIL: The patient was identified by armband,  received preoperative IV antibiotics in the holding area at [facility], taken to the operating room , appropriate anesthetic monitors  were attached and  anesthesia was induced with the patienton the gurney. The HANA boots were applied to the feet and he was then transferred to the HANA table with a peroneal post and support underneath the  non-operative le, which was locked in 5 lb traction. Theoperative lower extremity was then prepped and draped in the usual sterile fashion from just above the iliac crest to the knee. And a timeout procedure was performed. We then made a 10 cm incision along the interval at the leading edge of the tensor fascia lata of starting at 2 cm lateral to and 2 cm distal to the ASIS. Small bleeders in the skin and subcutaneous tissue identified and cauterized we dissected down to the fascia and made an incision in the fascia allowing [place] to elevate the fascia of the tensor muscle and exploited the interval between the rectus and the tensor fascia lata. A Hohmann retractor was then placed along the superior neck of the femur and a Cobra retractor along the inferior neck of the femur we teed the capsule starting out at the superior anterior aspect of the acetabulum going distally and made the T along the neck both leaflets of the T were tagged with #2 Ethibond suture. Cobra retractors were then placed along the inferior and superior neck allowing [place] to perform a standard neck  cut and removed the femoral head with a power corkscrew. We then placed a right angle Hohmann retractor along the anterior aspect of the acetabulum a spiked Cobra in the cotyloid notch and posteriorly a Muelller retractor. We then sequentially reamed up to a 53 mm basket reamer obtaining good coverage in all quadrants, verified by C-arm imaging. Under C-arm control with and hammered into place a 54 mm Pinnacle cup in 45 of abduction and 15 of anteversion. The cup seated nicely and required no supplemental screws. We then placed a central hole Eliminator and a 0 polyethylene liner. The foot was then externally rotated to 100, the HANA elevator was placed around the flare of the greater trochanter and the limb was extended and abducted delivering the proximal femur up into the wound. A medium Hohmann retractor was placed over the greater trochanter and a  Mueller retractor along the posterior femoral neck completing the exposure. We then performed releases superiorly and and inferiorly of the capsule going back to the pirformis fossa superiorly and to the lesser trochanter inferiorly. We then entered the proximal femur with the box cutting offset chisel followed by, a canal sounder, the chili pepper and broaching up to a 5 broach. This seated nicely and we reamed the calcar. A trial reduction was performed with a 1.5 mm 36 mm head.The limb lengths were excellent the hip was stable in 90 of external rotation. At this point the trial components removed and we hammered into place a # 5 Tri-Lock stem with Gryption coating. This was a hi offset stem and a + 1.5 36 mm ceramic ball was then hammered into place the hip was reduced and final C-arm images obtained. The wound was thoroughly irrigated with normal saline solution. We repaired the ant capsule and the tensor fascia lot a with running 0 vicryl suture. the subcutaneous tissue was closed with 2-0 and 3-0 Vicryl suture followed by an Aquacil dressing. At this point the patient was awaken and transferred to hospital gurney without difficulty. The subcutaneous tissue with 0 and 2-0 undyed Vicryl suture and the skin with running  3-0 vicryl subcuticular suture. Aquacil dressing was applied. The patient was then unclamped, rolled supine, awaken extubated and taken to recovery room without difficulty in stable condition.   Jairo Bellew J 06/05/2016, 8:39 AM

## 2016-06-05 NOTE — Interval H&P Note (Signed)
History and Physical Interval Note:  06/05/2016 7:14 AM  Andrew Frost  has presented today for surgery, with the diagnosis of LEFT HIP AVASCULAR NECROSIS  The various methods of treatment have been discussed with the patient and family. After consideration of risks, benefits and other options for treatment, the patient has consented to  Procedure(s): TOTAL HIP ARTHROPLASTY ANTERIOR APPROACH (Left) as a surgical intervention .  The patient's history has been reviewed, patient examined, no change in status, stable for surgery.  I have reviewed the patient's chart and labs.  Questions were answered to the patient's satisfaction.     Kerin Salen

## 2016-06-05 NOTE — Transfer of Care (Signed)
Immediate Anesthesia Transfer of Care Note  Patient: Andrew Frost  Procedure(s) Performed: Procedure(s): TOTAL HIP ARTHROPLASTY ANTERIOR APPROACH (Left)  Patient Location: PACU  Anesthesia Type:Spinal  Level of Consciousness: awake  Airway & Oxygen Therapy: Patient Spontanous Breathing and Patient connected to nasal cannula oxygen  Post-op Assessment: Report given to RN and Post -op Vital signs reviewed and stable  Post vital signs: Reviewed and stable  Last Vitals:  Filed Vitals:   06/05/16 0606 06/05/16 0910  BP: 129/86 111/85  Pulse: 76 83  Temp: 36.8 C 36.6 C  Resp:  10    Last Pain:  Filed Vitals:   06/05/16 0911  PainSc: 6       Patients Stated Pain Goal: 3 ([id number])  Complications: No apparent anesthesia complications

## 2016-06-05 NOTE — Evaluation (Signed)
Physical Therapy Evaluation Patient Details Name: Andrew Frost MRN: [id number] DOB: 04-Aug-1976 Today's Date: 06/05/2016   History of Present Illness  40 y.o. male with AVN now s/p Lt direct anterior THA.   Clinical Impression  Pt is s/p THA resulting in the deficits listed below (see PT Problem List).  Pt will benefit from skilled PT to increase their independence and safety with mobility to allow discharge to home with family support. Pt able to ambulate 25 ft with rw and min guard assist during initial session.       Follow Up Recommendations Home health PT;Supervision for mobility/OOB    Equipment Recommendations  Rolling walker with 5" wheels    Recommendations for Other Services       Precautions / Restrictions Precautions Precautions: Fall Precaution Comments: HEP provided Restrictions Weight Bearing Restrictions: Yes LLE Weight Bearing: Weight bearing as tolerated      Mobility  Bed Mobility Overal bed mobility: Needs Assistance Bed Mobility: Supine to Sit     Supine to sit: Min assist     General bed mobility comments: min assist LLE, pt using rails to assist, HOB elevated.   Transfers Overall transfer level: Needs assistance Equipment used: Rolling walker (2 wheeled) Transfers: Sit to/from Stand Sit to Stand: Min guard         General transfer comment: cues for hand placement  Ambulation/Gait Ambulation/Gait assistance: Min guard Ambulation Distance (Feet): 25 Feet Assistive device: Rolling walker (2 wheeled) Gait Pattern/deviations: Step-through pattern;Decreased weight shift to left;Decreased stance time - left Gait velocity: decreased   General Gait Details: pt stopping to stretch LLE while ambulating, reports feeling better up and moving.   Stairs            Wheelchair Mobility    Modified Rankin (Stroke Patients Only)       Balance Overall balance assessment: Needs assistance Sitting-balance support: No upper extremity  supported Sitting balance-Leahy Scale: Good     Standing balance support: Bilateral upper extremity supported Standing balance-Leahy Scale: Poor Standing balance comment: using rw                             Pertinent Vitals/Pain Pain Assessment: 0-10 Pain Score: 7  Pain Location: Lt hip Pain Descriptors / Indicators: Aching Pain Intervention(s): Limited activity within patient's tolerance;Monitored during session;Ice applied    Home Living Family/patient expects to be discharged to:: Private residence Living Arrangements: Spouse/significant other Available Help at Discharge: Family;Available 24 hours/day Type of Home: House Home Access: Stairs to enter Entrance Stairs-Rails: None Entrance Stairs-Number of Steps: 1 Home Layout: Two level (split entry) Home Equipment: Cane - single point      Prior Function Level of Independence: Independent (occasional use of SPC)               Hand Dominance        Extremity/Trunk Assessment   Upper Extremity Assessment: Overall WFL for tasks assessed           Lower Extremity Assessment: LLE deficits/detail   LLE Deficits / Details: difficulty with moving LLE for bed mobility     Communication   Communication: No difficulties  Cognition Arousal/Alertness: Awake/alert Behavior During Therapy: WFL for tasks assessed/performed Overall Cognitive Status: Within Functional Limits for tasks assessed                      General Comments      Exercises  Assessment/Plan    PT Assessment Patient needs continued PT services  PT Diagnosis Difficulty walking   PT Problem List Decreased strength;Decreased range of motion;Decreased activity tolerance;Decreased balance;Decreased mobility  PT Treatment Interventions DME instruction;Gait training;Stair training;Functional mobility training;Therapeutic activities;Balance training;Therapeutic exercise;Patient/family education   PT Goals (Current  goals can be found in the Care Plan section) Acute Rehab PT Goals Patient Stated Goal: get home and moving again PT Goal Formulation: With patient Time For Goal Achievement: 06/19/16 Potential to Achieve Goals: Good    Frequency 7X/week   Barriers to discharge        Co-evaluation               End of Session Equipment Utilized During Treatment: Gait belt Activity Tolerance: Patient tolerated treatment well Patient left: in chair;with call bell/phone within reach;with family/visitor present Nurse Communication: Mobility status;Weight bearing status         Time: 1315-1343 PT Time Calculation (min) (ACUTE ONLY): 28 min   Charges:   PT Evaluation $PT Eval Moderate Complexity: 1 Procedure PT Treatments $Therapeutic Activity: 8-22 mins   PT G Codes:        Cassell Clement, PT, CSCS Pager [phone] Office [phone]  06/05/2016, 1:53 PM

## 2016-06-05 NOTE — Anesthesia Postprocedure Evaluation (Signed)
Anesthesia Post Note  Patient: SWAY HOGLUND  Procedure(s) Performed: Procedure(s) (LRB): TOTAL HIP ARTHROPLASTY ANTERIOR APPROACH (Left)  Patient location during evaluation: PACU Anesthesia Type: Spinal Level of consciousness: awake Pain management: satisfactory to patient Vital Signs Assessment: post-procedure vital signs reviewed and stable Respiratory status: spontaneous breathing Cardiovascular status: blood pressure returned to baseline Postop Assessment: no headache and spinal receding Anesthetic complications: no    Last Vitals:  Filed Vitals:   06/05/16 1027 06/05/16 1030  BP:    Pulse: 59   Temp:  36.2 C  Resp: 14 15    Last Pain:  Filed Vitals:   06/05/16 1033  PainSc: Asleep    LLE Motor Response: Purposeful movement (moves foot) (06/05/16 1034)   RLE Motor Response: Purposeful movement (moves foot) (06/05/16 1034)   L Sensory Level: S1-Sole of foot, small toes (06/05/16 1034) R Sensory Level: S1-Sole of foot, small toes (06/05/16 1034)  Doneisha Ivey EDWARD

## 2016-06-06 ENCOUNTER — Encounter (HOSPITAL_COMMUNITY): Payer: Self-pay | Admitting: Orthopedic Surgery

## 2016-06-06 LAB — BASIC METABOLIC PANEL
ANION GAP: 7 (ref 5–15)
BUN: 11 mg/dL (ref 6–20)
CALCIUM: 8.9 mg/dL (ref 8.9–10.3)
CO2: 28 mmol/L (ref 22–32)
CREATININE: 1.15 mg/dL (ref 0.61–1.24)
Chloride: 103 mmol/L (ref 101–111)
Glucose, Bld: 120 mg/dL — ABNORMAL HIGH (ref 65–99)
Potassium: 4.6 mmol/L (ref 3.5–5.1)
SODIUM: 138 mmol/L (ref 135–145)

## 2016-06-06 LAB — CBC
HCT: 40.4 % (ref 39.0–52.0)
Hemoglobin: 13.2 g/dL (ref 13.0–17.0)
MCH: 30 pg (ref 26.0–34.0)
MCHC: 32.7 g/dL (ref 30.0–36.0)
MCV: 91.8 fL (ref 78.0–100.0)
PLATELETS: 153 10*3/uL (ref 150–400)
RBC: 4.4 MIL/uL (ref 4.22–5.81)
RDW: 14.2 % (ref 11.5–15.5)
WBC: 7.3 10*3/uL (ref 4.0–10.5)

## 2016-06-06 NOTE — Evaluation (Signed)
Occupational Therapy Evaluation and Discharge Patient Details Name: Andrew Frost MRN: [id number] DOB: [birth year] Today's Date: 06/06/2016    History of Present Illness 40 y.o. male with AVN now s/p Lt direct anterior THA.    Clinical Impression   Pt was independent prior to admission in ADL. Pt presents with increased L thigh pain and generalized weakness. He requires min assist for LB ADL. Practiced shower transfers with supervision. Educated in availability of AE for LB ADL, but pt choosing to rely on family's assist. Educated in multiple uses of 3 in 1, transporting items safely with walker and safe footwear. Pt with no further questions. Verbalized understanding of all education.     Follow Up Recommendations  No OT follow up    Equipment Recommendations  3 in 1 bedside comode    Recommendations for Other Services       Precautions / Restrictions Precautions Precautions: Fall Restrictions Weight Bearing Restrictions: Yes LLE Weight Bearing: Weight bearing as tolerated      Mobility Bed Mobility Overal bed mobility: Modified Independent             General bed mobility comments: no physical assist, HOB up  Transfers Overall transfer level: Needs assistance Equipment used: Rolling walker (2 wheeled) Transfers: Sit to/from Stand Sit to Stand: Supervision         General transfer comment: good technique    Balance                                            ADL Overall ADL's : Needs assistance/impaired Eating/Feeding: Independent;Sitting   Grooming: Supervision/safety;Standing   Upper Body Bathing: Set up;Sitting   Lower Body Bathing: Minimal assistance;Sit to/from stand   Upper Body Dressing : Set up;Sitting   Lower Body Dressing: Minimal assistance;Sit to/from stand   Toilet Transfer: Supervision/safety;Ambulation;BSC (over toilet)       Tub/ Shower Transfer: Supervision/safety;Ambulation;3 in 1;Rolling walker    Functional mobility during ADLs: Supervision/safety;Rolling walker General ADL Comments: Pt not interested in AE, pt will rely on his family.     Vision     Perception     Praxis      Pertinent Vitals/Pain Pain Assessment: Faces Faces Pain Scale: Hurts even more Pain Location: L thigh Pain Descriptors / Indicators: Guarding;Grimacing;Sore Pain Intervention(s): Monitored during session;Premedicated before session;Repositioned;Ice applied     Hand Dominance Right   Extremity/Trunk Assessment Upper Extremity Assessment Upper Extremity Assessment: Overall WFL for tasks assessed   Lower Extremity Assessment Lower Extremity Assessment: Defer to PT evaluation       Communication Communication Communication: No difficulties   Cognition Arousal/Alertness: Awake/alert Behavior During Therapy: WFL for tasks assessed/performed Overall Cognitive Status: Within Functional Limits for tasks assessed                     General Comments       Exercises       Shoulder Instructions      Home Living Family/patient expects to be discharged to:: Private residence Living Arrangements: Spouse/significant other Available Help at Discharge: Family;Available 24 hours/day Type of Home: House Home Access: Stairs to enter [employer] of Steps: 1 Entrance Stairs-Rails: None Home Layout: Two level (split) Alternate Level Stairs-Number of Steps: 6 Alternate Level Stairs-Rails: Left Bathroom Shower/Tub: Occupational psychologist: Handicapped height     Home Equipment: Cane - single  point          Prior Functioning/Environment Level of Independence: Independent with assistive device(s)             OT Diagnosis: Generalized weakness;Acute pain   OT Problem List:     OT Treatment/Interventions:      OT Goals(Current goals can be found in the care plan section) Acute Rehab OT Goals Patient Stated Goal: get home and moving again  OT Frequency:      Barriers to D/C:            Co-evaluation              End of Session Equipment Utilized During Treatment: Gait belt;Rolling walker  Activity Tolerance: Patient tolerated treatment well Patient left: in bed;with call bell/phone within reach;with family/visitor present   Time: 0825-0849 OT Time Calculation (min): 24 min Charges:  OT General Charges $OT Visit: 1 Procedure OT Evaluation $OT Eval Low Complexity: 1 Procedure G-Codes:    Malka So 06/06/2016, 8:58 AM  [phone]

## 2016-06-06 NOTE — Discharge Summary (Signed)
Patient ID: Andrew Frost MRN: [id number] DOB/AGE: [birth year] 40 y.o.  Admit date: 06/05/2016 Discharge date: 06/06/2016  Admission Diagnoses:  Principal Problem:   Avascular necrosis of bone of left hip [facility]) Active Problems:   Avascular necrosis of hip [facility])   Discharge Diagnoses:  Same  Past Medical History  Diagnosis Date  . Prostate calculus   . Diverticulitis     Surgeries: Procedure(s): TOTAL HIP ARTHROPLASTY ANTERIOR APPROACH on 06/05/2016   Consultants:    Discharged Condition: Improved  Hospital Course: Andrew Frost is an 40 y.o. male who was admitted 06/05/2016 for operative treatment ofAvascular necrosis of bone of left hip ([place]). Patient has severe unremitting pain that affects sleep, daily activities, and work/hobbies. After pre-op clearance the patient was taken to the operating room on 06/05/2016 and underwent  Procedure(s): TOTAL HIP ARTHROPLASTY ANTERIOR APPROACH.    Patient was given perioperative antibiotics: Anti-infectives    Start     Dose/Rate Route Frequency Ordered Stop   06/05/16 0700  ceFAZolin (ANCEF) 3 g in dextrose 5 % 50 mL IVPB     3 g 130 mL/hr over 30 Minutes Intravenous To [facility]Stay [facility] 06/02/16 1407 06/05/16 0720       Patient was given sequential compression devices, early ambulation, and chemoprophylaxis to prevent DVT.  Patient benefited maximally from hospital stay and there were no complications.    Recent vital signs: Patient Vitals for the past 24 hrs:  BP Temp Temp src Pulse SpO2  06/06/16 0325 118/76 mmHg 97.5 F (36.4 C) Oral 77 96 %  06/05/16 2238 124/78 mmHg 98.5 F (36.9 C) Oral 81 98 %  06/05/16 1930 133/81 mmHg 98.3 F (36.8 C) Oral 86 99 %     Recent laboratory studies:  Recent Labs  06/06/16 0538  WBC 7.3  HGB 13.2  HCT 40.4  PLT 153  NA 138  K 4.6  CL 103  CO2 28  BUN 11  CREATININE 1.15  GLUCOSE 120*  CALCIUM 8.9     Discharge Medications:     Medication List    TAKE these  medications        alendronate 70 MG tablet  Commonly known as:  FOSAMAX  Take 1 tablet (70 mg total) by mouth every 7 (seven) days.     aspirin EC 325 MG tablet  Take 1 tablet (325 mg total) by mouth 2 (two) times daily.     meloxicam 15 MG tablet  Commonly known as:  MOBIC  Take 15 mg by mouth daily.     methocarbamol 500 MG tablet  Commonly known as:  ROBAXIN  Take 1 tablet (500 mg total) by mouth 2 (two) times daily with a meal.     multivitamin tablet  Take 1 tablet by mouth daily.     oxyCODONE-acetaminophen 10-325 MG tablet  Commonly known as:  PERCOCET  Take 1 tablet by mouth every 6 (six) hours as needed for pain.     phentermine 37.5 MG tablet  Commonly known as:  ADIPEX-P  Take 0.5 tablets by mouth daily before breakfast. Only during the week (Mon, Tue, Wed, Thurs, and Fri); not on the weekends     STOOL SOFTENER & LAXATIVE PO  Take 1 tablet by mouth daily.        Diagnostic Studies: Dg Chest 2 View  05/25/2016  CLINICAL DATA:  Pre admit for June 26 for left anterior approach total hip, nonsmoker, pt shielded EXAM: CHEST  2 VIEW COMPARISON:  04/25/2016 FINDINGS:  The heart size and mediastinal contours are within normal limits. Both lungs are clear. The visualized skeletal structures are unremarkable. IMPRESSION: No active cardiopulmonary disease. Electronically Signed   By: Skipper Cliche M.D.   On: 05/25/2016 09:49   Dg Hip Operative Unilat W Or W/o Pelvis Left  06/05/2016  CLINICAL DATA:  Immediate postoperative fluoro spot intraoperative views of a left total hip prosthesis placement. EXAM: OPERATIVE left HIP (WITH PELVIS IF PERFORMED) 2 VIEWS TECHNIQUE: Fluoroscopic spot image(s) were submitted for interpretation post-operatively. COMPARISON:  Coronal and sagittal CT images from a scan of January 21, 2016 FINDINGS: The patient has undergone left total hip joint prosthesis placement. The positioning of the prosthetic components appears good. The interface with  the native bone is normal. IMPRESSION: No immediate postprocedure complication following left total hip joint replacement. Electronically Signed   By: David  Martinique M.D.   On: 06/05/2016 08:56    Disposition: Final discharge disposition not confirmed      Discharge Instructions    Call MD / Call 911    Complete by:  As directed   If you experience chest pain or shortness of breath, CALL 911 and be transported to the hospital emergency room.  If you develope a fever above 101 F, pus (white drainage) or increased drainage or redness at the wound, or calf pain, call your surgeon's office.     Constipation Prevention    Complete by:  As directed   Drink plenty of fluids.  Prune juice may be helpful.  You may use a stool softener, such as Colace (over the counter) 100 mg twice a day.  Use MiraLax (over the counter) for constipation as needed.     Diet - low sodium heart healthy    Complete by:  As directed      Driving restrictions    Complete by:  As directed   No driving for 2 weeks     Follow the hip precautions as taught in Physical Therapy    Complete by:  As directed      Increase activity slowly as tolerated    Complete by:  As directed      Patient may shower    Complete by:  As directed   You may shower without a dressing once there is no drainage.  Do not wash over the wound.  If drainage remains, cover wound with plastic wrap and then shower.           Follow-up Information    Follow up with Kerin Salen, MD In 2 weeks.   Specialty:  Orthopedic Surgery   Contact information:   [place] [phone]        Signed: Theodosia Quay 06/06/2016, 12:39 PM

## 2016-06-06 NOTE — Progress Notes (Signed)
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     PT Comments    Patient is progressing well toward mobility goals. Tolerated stair/gait training. Overall supervision/mod I. Wife present for session. Current plan remains appropriate.   Follow Up Recommendations  Home health PT;Supervision for mobility/OOB     Equipment Recommendations  Rolling walker with 5" wheels;3in1 (PT)    Recommendations for Other Services       Precautions / Restrictions Precautions Precautions: Fall Precaution Comments: HEP provided Restrictions Weight Bearing Restrictions: Yes LLE Weight Bearing: Weight bearing as tolerated    Mobility  Bed Mobility Overal bed mobility: Modified Independent Bed Mobility: Supine to Sit     Supine to sit: Modified independent (Device/Increase time)     General bed mobility comments: increased time and HOB elevated   Transfers Overall transfer level: Needs assistance Equipment used: Rolling walker (2 wheeled) Transfers: Sit to/from Stand Sit to Stand: Supervision         General transfer comment: safe hand placement and technique  Ambulation/Gait Ambulation/Gait assistance: Supervision Ambulation Distance (Feet): 200 Feet Assistive device: Rolling walker (2 wheeled) Gait Pattern/deviations: Step-through pattern;Decreased weight shift to left;Decreased stance time - left Gait velocity: decreased   General Gait Details: cues for posture and increased WB on L LE; pt with improved L knee flexion and heel strike with increased distance   Stairs Stairs: Yes Stairs assistance: Supervision Stair Management: One rail Left;Sideways Number of Stairs: 2 General stair comments: educated on sequencing and technique; good safety awareness  Wheelchair Mobility    Modified Rankin (Stroke Patients  Only)       Balance Overall balance assessment: Needs assistance Sitting-balance support: No upper extremity supported;Feet supported Sitting balance-Leahy Scale: Good     Standing balance support: No upper extremity supported Standing balance-Leahy Scale: Fair Standing balance comment: static stand                    Cognition Arousal/Alertness: Awake/alert Behavior During Therapy: WFL for tasks assessed/performed Overall Cognitive Status: Within Functional Limits for tasks assessed                      Exercises Total Joint Exercises Knee Flexion: AROM;Left;20 reps;Standing    General Comments        Pertinent Vitals/Pain Pain Assessment: 0-10 Pain Score: 8  Faces Pain Scale: Hurts even more Pain Location: L hip (incision site and anterior thigh) Pain Descriptors / Indicators: Grimacing;Guarding;Sore Pain Intervention(s): Limited activity within patient's tolerance;Monitored during session;Premedicated before session;Repositioned    Home Living Family/patient expects to be discharged to:: Private residence Living Arrangements: Spouse/significant other Available Help at Discharge: Family;Available 24 hours/day Type of Home: House Home Access: Stairs to enter Entrance Stairs-Rails: None Home Layout: Two level (split) Home Equipment: Cane - single point      Prior Function Level of Independence: Independent with assistive device(s)          PT Goals (current goals can now be found in the care plan section) Acute Rehab PT Goals Patient Stated Goal: get home and moving again PT Goal Formulation: With patient Time For Goal Achievement: 06/19/16 Potential to Achieve Goals: Good Progress towards PT goals: Progressing toward goals    Frequency  7X/week    PT Plan Current plan remains appropriate    Co-evaluation  End of Session Equipment Utilized During Treatment: Gait belt Activity Tolerance: Patient tolerated treatment  well Patient left: in chair;with call bell/phone within reach;with family/visitor present     Time: [id number] PT Time Calculation (min) (ACUTE ONLY): 28 min  Charges:  $Gait Training: 8-22 mins                    G Codes:      Salina April, PTA Pager: [phone]   06/06/2016, 10:23 AM

## 2016-06-06 NOTE — Progress Notes (Signed)
Reviewed discharge instructions/medications with patient and patient's wife.  Answered all of their questions.

## 2016-06-06 NOTE — Progress Notes (Signed)
Patient ID: Andrew Frost, male   DOB: 03-18-1976, 40 y.o.   MRN: [id number] PATIENT ID: Andrew Frost  MRN: [id number]  DOB/AGE:  [birth year] / 40 y.o.  1 Day Post-Op Procedure(s) (LRB): TOTAL HIP ARTHROPLASTY ANTERIOR APPROACH (Left)    PROGRESS NOTE Subjective: Patient is alert, oriented, no Nausea, no Vomiting, yes passing gas, . Taking PO well. Denies SOB, Chest or Calf Pain. Using Incentive Spirometer, PAS in place. Ambulate WBAT 25 feet and room.  Yesterday Patient reports pain as  3/10  .    Objective: Vital signs in last 24 hours: Filed Vitals:   06/05/16 1117 06/05/16 1930 06/05/16 2238 06/06/16 0325  BP:  133/81 124/78 118/76  Pulse:  86 81 77  Temp:  98.3 F (36.8 C) 98.5 F (36.9 C) 97.5 F (36.4 C)  TempSrc:  Oral Oral Oral  Resp:      Height: 5\' 9"  (1.753 m)     Weight:      SpO2:  99% 98% 96%      Intake/Output from previous day: I/O last 3 completed shifts: In: 1240 [P.O.:240; I.V.:1000] Out: 1200 [Urine:950; Blood:250]   Intake/Output this shift:     LABORATORY DATA:  Recent Labs  06/06/16 0538  WBC 7.3  HGB 13.2  HCT 40.4  PLT 153  NA 138  K 4.6  CL 103  CO2 28  BUN 11  CREATININE 1.15  GLUCOSE 120*  CALCIUM 8.9    Examination: Neurologically intact ABD soft Neurovascular intact Sensation intact distally Intact pulses distally Dorsiflexion/Plantar flexion intact Incision: dressing C/D/I No cellulitis present Compartment soft} XR AP&Lat of hip shows well placed\fixed THA  Assessment:   1 Day Post-Op Procedure(s) (LRB): TOTAL HIP ARTHROPLASTY ANTERIOR APPROACH (Left) ADDITIONAL DIAGNOSIS:  Expected Acute Blood Loss Anemia,   Plan: PT/OT WBAT, THA  DVT Prophylaxis: SCDx72 hrs, ASA 325 mg BID x 2 weeks  DISCHARGE PLAN: Home  DISCHARGE NEEDS: HHPT, Walker and 3-in-1 comode seat 2

## 2016-06-06 NOTE — Progress Notes (Signed)
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     PT Comments    Patient is making good progress with PT.  From a mobility standpoint anticipate patient will be ready for DC home when medically ready.     Follow Up Recommendations  Home health PT;Supervision for mobility/OOB     Equipment Recommendations  Rolling walker with 5" wheels;3in1 (PT)    Recommendations for Other Services       Precautions / Restrictions Precautions Precautions: Fall Precaution Comments: HEP provided Restrictions Weight Bearing Restrictions: Yes LLE Weight Bearing: Weight bearing as tolerated    Mobility  Bed Mobility Overal bed mobility: Modified Independent Bed Mobility: Supine to Sit;Sit to Supine     Supine to sit: Modified independent (Device/Increase time) Sit to supine: Modified independent (Device/Increase time)   General bed mobility comments: increased time and HOB flat; no use of rails  Transfers Overall transfer level: Needs assistance Equipment used: Rolling walker (2 wheeled) Transfers: Sit to/from Stand Sit to Stand: Supervision         General transfer comment: cues for hand placement when descending to EOB  Ambulation/Gait Ambulation/Gait assistance: Supervision Ambulation Distance (Feet): 200 Feet Assistive device: Rolling walker (2 wheeled) Gait Pattern/deviations: Step-through pattern;Decreased weight shift to left;Decreased stance time - left Gait velocity: decreased   General Gait Details: cues for posture and increased WB on L LE; pt with improved L knee flexion and heel strike with increased distance   Stairs Stairs: Yes Stairs assistance: Supervision Stair Management: One rail Left;Sideways Number of Stairs: 2 General stair comments: educated on sequencing and technique; good safety  awareness  Wheelchair Mobility    Modified Rankin (Stroke Patients Only)       Balance Overall balance assessment: Needs assistance Sitting-balance support: No upper extremity supported;Feet supported Sitting balance-Leahy Scale: Good     Standing balance support: No upper extremity supported Standing balance-Leahy Scale: Fair Standing balance comment: static stand                    Cognition Arousal/Alertness: Awake/alert Behavior During Therapy: WFL for tasks assessed/performed Overall Cognitive Status: Within Functional Limits for tasks assessed                      Exercises Total Joint Exercises Quad Sets: Strengthening;15 reps;Supine;Both Gluteal Sets: Strengthening;Both;15 reps;Supine Heel Slides: AROM;Left;15 reps;Supine Hip ABduction/ADduction: AROM;Left;15 reps;Supine;Standing;Other (comment) (15 supine; 15 standing) Long Arc Quad: Strengthening;Left;15 reps;Seated Knee Flexion: AROM;Left;15 reps;Standing Marching in Standing: Strengthening;Left;15 reps;Standing    General Comments        Pertinent Vitals/Pain Pain Assessment: Faces Pain Score: 8  Faces Pain Scale: Hurts little more Pain Location: L hip with therex Pain Descriptors / Indicators: Guarding;Sore;Tightness Pain Intervention(s): Monitored during session;Premedicated before session;Repositioned    Home Living Family/patient expects to be discharged to:: Private residence Living Arrangements: Spouse/significant other Available Help at Discharge: Family;Available 24 hours/day Type of Home: House Home Access: Stairs to enter Entrance Stairs-Rails: None Home Layout: Two level (split) Home Equipment: Cane - single point      Prior Function Level of Independence: Independent with assistive device(s)          PT Goals (current goals can now be found in the care plan section) Acute Rehab PT Goals Patient Stated Goal: get home and moving again PT Goal Formulation:  With  patient Time For Goal Achievement: 06/19/16 Potential to Achieve Goals: Good Progress towards PT goals: Progressing toward goals    Frequency  7X/week    PT Plan Current plan remains appropriate    Co-evaluation             End of Session Equipment Utilized During Treatment: Gait belt Activity Tolerance: Patient tolerated treatment well Patient left: in chair;with call bell/phone within reach;with family/visitor present     Time: [id number] PT Time Calculation (min) (ACUTE ONLY): 30 min  Charges:  $Therapeutic Exercise: 8-22 mins $Therapeutic Activity: 8-22 mins                    G Codes:      Salina April, PTA Pager: [phone]    06/06/2016, 11:24 AM

## 2016-07-06 IMAGING — RF DG HIP (WITH PELVIS) OPERATIVE*L*
1 series · 2 of 2 positions shown · non-contrast
Comparison: Coronal and sagittal CT images from a scan [DATE]

CLINICAL DATA: Immediate postoperative fluoro spot intraoperative
views of a left total hip prosthesis placement.

EXAM:
OPERATIVE left HIP (WITH PELVIS IF PERFORMED) 2 VIEWS
TECHNIQUE: Fluoroscopic spot image(s) were submitted for interpretation
post-operatively.

[Series 1: run · 2 of 2 slices shown]
[im 1/2]
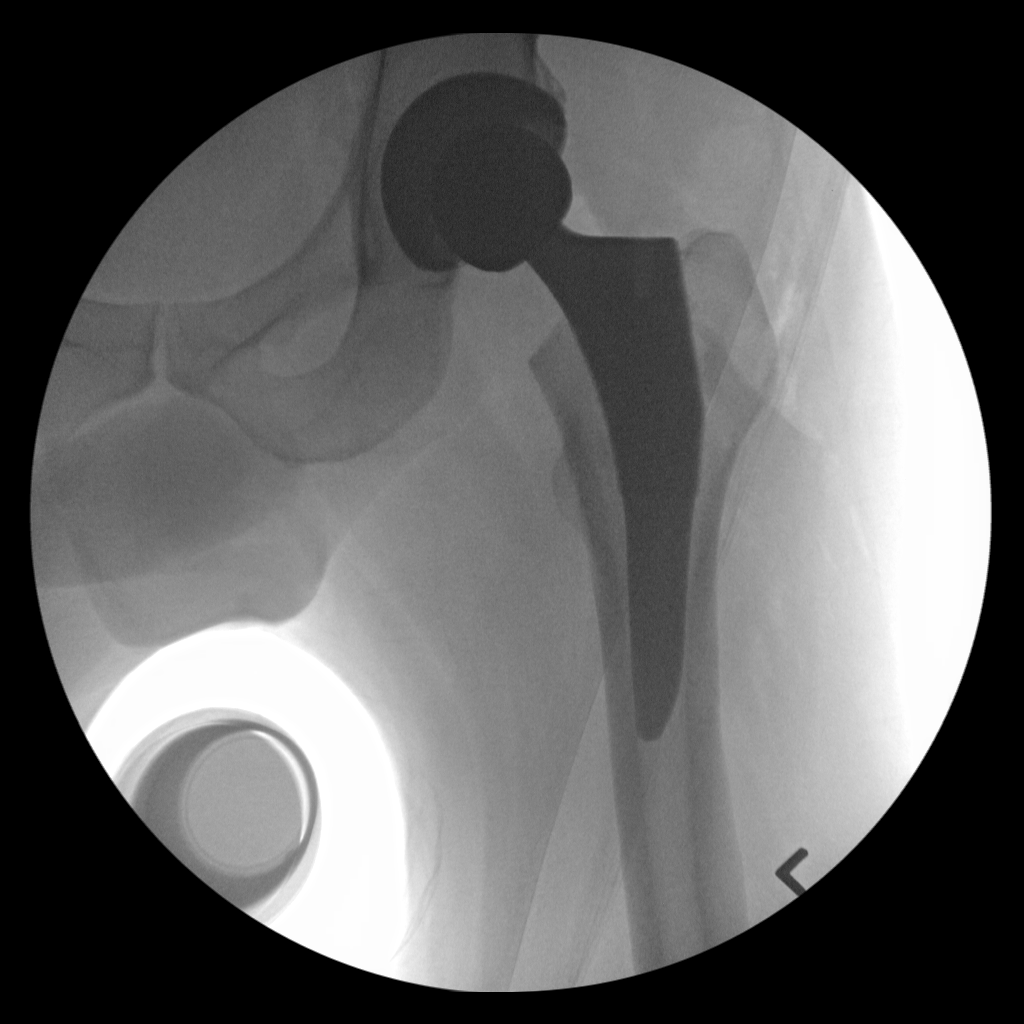
[im 2/2]
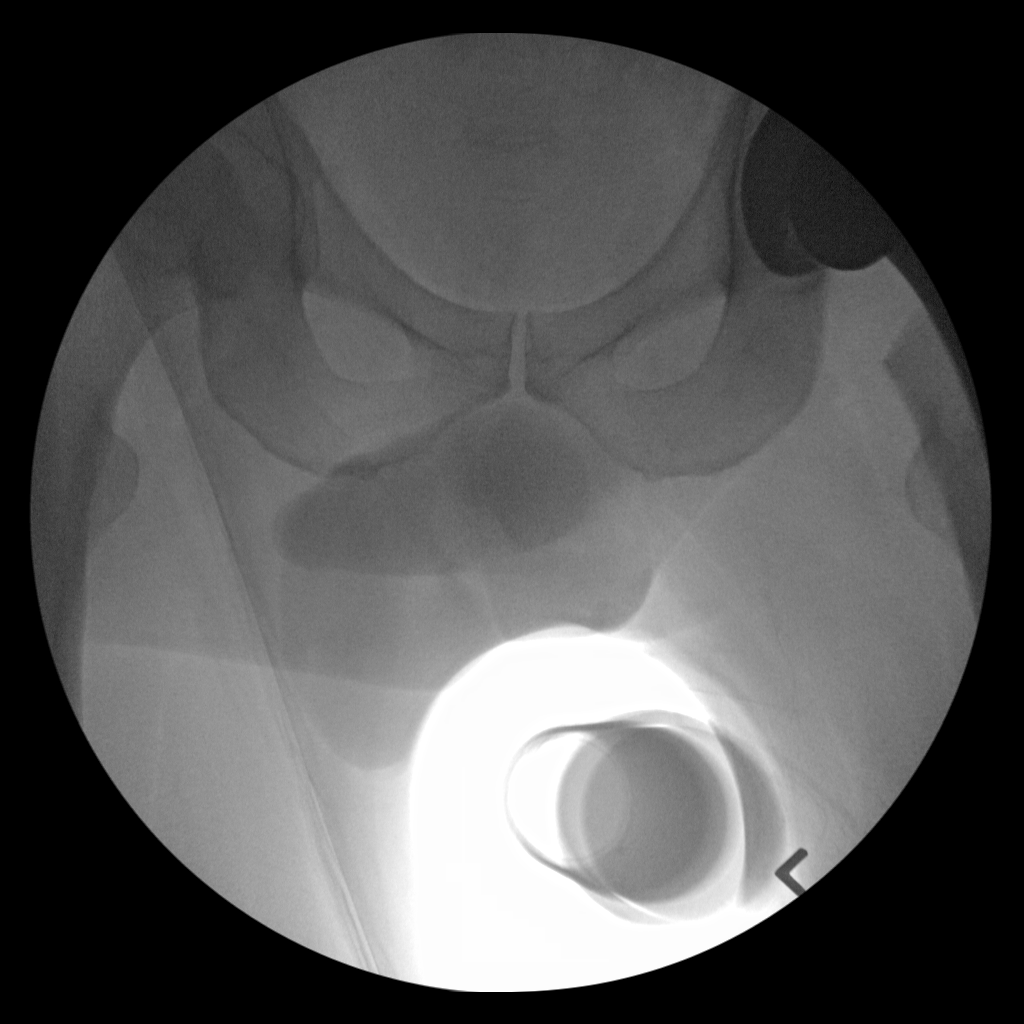

[2 of 2 positions shown; findings below may reference images not displayed]

FINDINGS: The patient has undergone left total hip joint prosthesis placement.
The positioning of the prosthetic components appears good. The
interface with the native bone is normal.
IMPRESSION: No immediate postprocedure complication following left total hip
joint replacement.

## 2016-09-29 ENCOUNTER — Ambulatory Visit (INDEPENDENT_AMBULATORY_CARE_PROVIDER_SITE_OTHER): Payer: BLUE CROSS/BLUE SHIELD

## 2016-09-29 ENCOUNTER — Ambulatory Visit (INDEPENDENT_AMBULATORY_CARE_PROVIDER_SITE_OTHER): Payer: BLUE CROSS/BLUE SHIELD | Admitting: Sports Medicine

## 2016-09-29 ENCOUNTER — Encounter: Payer: Self-pay | Admitting: Sports Medicine

## 2016-09-29 DIAGNOSIS — S6991XA Unspecified injury of right wrist, hand and finger(s), initial encounter: Secondary | ICD-10-CM

## 2016-09-29 DIAGNOSIS — M25531 Pain in right wrist: Secondary | ICD-10-CM | POA: Diagnosis not present

## 2016-09-29 DIAGNOSIS — Z23 Encounter for immunization: Secondary | ICD-10-CM

## 2016-09-29 MED ORDER — IBUPROFEN 800 MG PO TABS: 800.0000 mg | ORAL_TABLET | Freq: Three times a day (TID) | ORAL | 2 refills | Status: DC | PRN

## 2016-09-29 NOTE — Assessment & Plan Note (Signed)
Injured 11 days ago, pain over the distal radius and the anatomical snuffbox. Thumb spica brace, x-rays, return in 2 weeks. Ibuprofen for pain. Right handed duty only at work.

## 2016-09-29 NOTE — Progress Notes (Signed)
   Subjective:    I'm seeing this patient as a consultation for:  Dr. Beatrice Lecher  CC: Right wrist injury  HPI: Several days ago this pleasant 40 year old male was roller skating, dancing, and fell backwards onto an outstretched right hand. He had immediate pain and swelling, but an over-the-counter wrist brace but never had imaging. Unfortunately she's continued to have pain however swelling has improved. Localizes the pain over the dorsal wrist.  Past medical history:  Negative.  See flowsheet/record as well for more information.  Surgical history: Negative.  See flowsheet/record as well for more information.  Family history: Negative.  See flowsheet/record as well for more information.  Social history: Negative.  See flowsheet/record as well for more information.  Allergies, and medications have been entered into the medical record, reviewed, and no changes needed.   Review of Systems: No headache, visual changes, nausea, vomiting, diarrhea, constipation, dizziness, abdominal pain, skin rash, fevers, chills, night sweats, weight loss, swollen lymph nodes, body aches, joint swelling, muscle aches, chest pain, shortness of breath, mood changes, visual or auditory hallucinations.   Objective:   General: Well Developed, well nourished, and in no acute distress.  Neuro/Psych: Alert and oriented x3, extra-ocular muscles intact, able to move all 4 extremities, sensation grossly intact. Skin: Warm and dry, no rashes noted.  Respiratory: Not using accessory muscles, speaking in full sentences, trachea midline.  Cardiovascular: Pulses palpable, no extremity edema. Abdomen: Does not appear distended. Right Wrist: Inspection normal with no visible erythema or swelling. No visible bruising ROM smooth and normal with good flexion and extension and ulnar/radial deviation that is symmetrical with opposite wrist. Minimal pain at the snuffbox and the dorsal radiocarpal joint No tenderness over  [place]. Strength 5/5 in all directions without pain. Negative Finkelstein, tinel's and phalens. Negative Watson's test.  X-rays personally reviewed and are negative for fracture.  Impression and Recommendations:   This case required medical decision making of moderate complexity.  Right wrist injury Injured 11 days ago, pain over the distal radius and the anatomical snuffbox. Thumb spica brace, x-rays, return in 2 weeks. Ibuprofen for pain. Right handed duty only at work.

## 2016-10-13 ENCOUNTER — Ambulatory Visit (INDEPENDENT_AMBULATORY_CARE_PROVIDER_SITE_OTHER): Payer: BLUE CROSS/BLUE SHIELD | Admitting: Sports Medicine

## 2016-10-13 ENCOUNTER — Encounter: Payer: Self-pay | Admitting: Sports Medicine

## 2016-10-13 DIAGNOSIS — S6991XD Unspecified injury of right wrist, hand and finger(s), subsequent encounter: Secondary | ICD-10-CM

## 2016-10-13 NOTE — Progress Notes (Signed)
  Subjective:    CC: Follow-up  HPI: 3 weeks ago this pleasant 40 year old male had a fall into an outstretched hand, he had immediate pain, swelling, bruising however x-rays were negative for fracture. We have had him in a thumb spica brace but unfortunately he has continued to have severe pain over the dorsal distal radius with occasional catching sensations. Pain is persistent without radiation.  Past medical history:  Negative.  See flowsheet/record as well for more information.  Surgical history: Negative.  See flowsheet/record as well for more information.  Family history: Negative.  See flowsheet/record as well for more information.  Social history: Negative.  See flowsheet/record as well for more information.  Allergies, and medications have been entered into the medical record, reviewed, and no changes needed.   Review of Systems: No fevers, chills, night sweats, weight loss, chest pain, or shortness of breath.   Objective:    General: Well Developed, well nourished, and in no acute distress.  Neuro: Alert and oriented x3, extra-ocular muscles intact, sensation grossly intact.  HEENT: Normocephalic, atraumatic, pupils equal round reactive to light, neck supple, no masses, no lymphadenopathy, thyroid nonpalpable.  Skin: Warm and dry, no rashes. Cardiac: Regular rate and rhythm, no murmurs rubs or gallops, no lower extremity edema.  Respiratory: Clear to auscultation bilaterally. Not using accessory muscles, speaking in full sentences. Right Wrist: Visibly swollen with tenderness over the radiocarpal joint and the anatomical snuffbox ROM smooth and normal with good flexion and extension and ulnar/radial deviation that is symmetrical with opposite wrist. Palpation is normal over metacarpals, navicular, lunate, and TFCC; tendons without tenderness/ swelling No tenderness over [place]. Strength 5/5 in all directions without pain. Negative Finkelstein, tinel's and  phalens. Positive Watson's test.  Impression and Recommendations:    Right wrist injury Persistent pain with negative x-rays, persistent swelling. Question scapholunate injury. We will proceed with MRI arthrogram, ideally on Monday.  I spent 25 minutes with this patient, greater than 50% was face-to-face time counseling regarding the above diagnoses

## 2016-10-13 NOTE — Assessment & Plan Note (Signed)
Persistent pain with negative x-rays, persistent swelling. Question scapholunate injury. We will proceed with MRI arthrogram, ideally on Monday.

## 2016-10-16 ENCOUNTER — Ambulatory Visit (INDEPENDENT_AMBULATORY_CARE_PROVIDER_SITE_OTHER): Payer: BLUE CROSS/BLUE SHIELD | Admitting: Sports Medicine

## 2016-10-16 ENCOUNTER — Ambulatory Visit (INDEPENDENT_AMBULATORY_CARE_PROVIDER_SITE_OTHER): Payer: BLUE CROSS/BLUE SHIELD

## 2016-10-16 DIAGNOSIS — S63591D Other specified sprain of right wrist, subsequent encounter: Secondary | ICD-10-CM

## 2016-10-16 DIAGNOSIS — S6991XD Unspecified injury of right wrist, hand and finger(s), subsequent encounter: Secondary | ICD-10-CM

## 2016-10-16 MED ORDER — TRAMADOL HCL 50 MG PO TABS: ORAL_TABLET | ORAL | 0 refills | Status: DC

## 2016-10-16 NOTE — Progress Notes (Signed)
  Procedure: Real-time Ultrasound Guided gadolinium contrast injection of right radiocarpal joint Device: GE Logiq E  Verbal informed consent obtained.  Time-out conducted.  Noted no overlying erythema, induration, or other signs of local infection.  Skin prepped in a sterile fashion.  Local anesthesia: Topical Ethyl chloride.  With sterile technique and under real time ultrasound guidance:  25-gauge advanced into the radiocarpal joint, 1 mL kenalog 40, 2 mL lidocaine injected easily, syringe switched and 0.05 mL gadolinium injected, syringe again switched and 2-3 mL sterile saline injected. Joint visualized and capsule seen distending confirming intra-articular placement of contrast material and medication. Completed without difficulty  Advised to call if fevers/chills, erythema, induration, drainage, or persistent bleeding.  Images permanently stored and available for review in the ultrasound unit.  Impression: Technically successful ultrasound guided gadolinium contrast injection for MR arthrography.  Please see separate MR arthrogram report.

## 2016-10-16 NOTE — Assessment & Plan Note (Signed)
Injection for MR arthrogram. Results to follow.

## 2016-12-14 ENCOUNTER — Encounter: Payer: Self-pay | Admitting: Family Medicine

## 2016-12-14 ENCOUNTER — Ambulatory Visit (INDEPENDENT_AMBULATORY_CARE_PROVIDER_SITE_OTHER): Payer: BLUE CROSS/BLUE SHIELD | Admitting: Family Medicine

## 2016-12-14 VITALS — BP 135/80 | HR 83 | Temp 98.8°F | Ht 69.0 in | Wt 200.0 lb

## 2016-12-14 DIAGNOSIS — J0140 Acute pansinusitis, unspecified: Secondary | ICD-10-CM | POA: Diagnosis not present

## 2016-12-14 MED ORDER — AMOXICILLIN-POT CLAVULANATE 875-125 MG PO TABS: 1.0000 | ORAL_TABLET | Freq: Two times a day (BID) | ORAL | 0 refills | Status: DC

## 2016-12-14 NOTE — Patient Instructions (Addendum)

## 2016-12-14 NOTE — Progress Notes (Signed)
   Subjective:    Patient ID: Andrew Frost, male    DOB: January 13, 1976, 41 y.o.   MRN: [id number]  HPI pt reports that his sxs began around Thanksgiving. he stated that he has facial pressure, L side neck pain, sinus drainage, semi productive cough, nasal congestion, thick yellow/green/bloody mucus, post nasal drainage that is especially bad at night. he has used alka seltzer plus, vicks rub, hot showers, sinunex nasal spray, sinus flush(not nettie pot)tried mucinex 1x didn't like how it made him feel    Review of Systems     Objective:   Physical Exam  Constitutional: He is oriented to person, place, and time. He appears well-developed and well-nourished.  HENT:  Head: Normocephalic and atraumatic.  Right Ear: External ear normal.  Left Ear: External ear normal.  Nose: Nose normal.  Mouth/Throat: Oropharynx is clear and moist.  TMs and canals are clear. No nasal edema  Eyes: Conjunctivae and EOM are normal. Pupils are equal, round, and reactive to light.  Neck: Neck supple. No thyromegaly present.  Cardiovascular: Normal rate and normal heart sounds.   Pulmonary/Chest: Effort normal and breath sounds normal.  Lymphadenopathy:    He has no cervical adenopathy.  Neurological: He is alert and oriented to person, place, and time.  Skin: Skin is warm and dry.  Psychiatric: He has a normal mood and affect.       Assessment & Plan:  Acute sinusitis-we'll treat with Augmentin. Call if not significantly better in 4-5 days. Okay to continue over-the-counter medications and increase hydration.

## 2017-01-24 ENCOUNTER — Other Ambulatory Visit: Payer: Self-pay | Admitting: Sports Medicine

## 2017-01-24 DIAGNOSIS — M87052 Idiopathic aseptic necrosis of left femur: Principal | ICD-10-CM

## 2017-01-24 DIAGNOSIS — M87051 Idiopathic aseptic necrosis of right femur: Secondary | ICD-10-CM

## 2018-10-08 ENCOUNTER — Ambulatory Visit (INDEPENDENT_AMBULATORY_CARE_PROVIDER_SITE_OTHER): Payer: 59 | Admitting: Family Medicine

## 2018-10-08 ENCOUNTER — Encounter: Payer: Self-pay | Admitting: Family Medicine

## 2018-10-08 VITALS — BP 135/85 | HR 82 | Ht 69.0 in | Wt 214.0 lb

## 2018-10-08 DIAGNOSIS — Z23 Encounter for immunization: Secondary | ICD-10-CM

## 2018-10-08 DIAGNOSIS — R030 Elevated blood-pressure reading, without diagnosis of hypertension: Secondary | ICD-10-CM | POA: Diagnosis not present

## 2018-10-08 DIAGNOSIS — Z0000 Encounter for general adult medical examination without abnormal findings: Secondary | ICD-10-CM

## 2018-10-08 DIAGNOSIS — R0789 Other chest pain: Secondary | ICD-10-CM

## 2018-10-08 DIAGNOSIS — R5383 Other fatigue: Secondary | ICD-10-CM | POA: Diagnosis not present

## 2018-10-08 DIAGNOSIS — R9431 Abnormal electrocardiogram [ECG] [EKG]: Secondary | ICD-10-CM

## 2018-10-08 NOTE — Progress Notes (Signed)
Subjective:    Patient ID: Andrew Frost, male    DOB: 10-27-1976, 42 y.o.   MRN: [id number]  HPI 42 year old male with a history of avascular necrosis of the hips comes in today complaining of some chest pain.  He has been going through a divorce.  Not sure if heartburn related. He is now a single parent.  He hasn't tried any medication for his symptoms.  He denies any radiation of pain down his arms or up into the neck.  No diaphoresis.  No shortness of breath or nausea with the symptoms.  He says it usually just lasts a few minutes and then goes away on its own.  He says he does have a history of heartburn and occasionally takes Tums but says he is never taken the Tums for the chest pain.  He admits he is not sleeping well but says it is not really new.  More recently he did change his diet has cut out soda.  Does report a family history of heart disease but not premature heart disease.  He c/o of low energy x 6 months .  He was talking with some friends and wonders if he could actually have some low testosterone levels that might be contributing to his low energy.  Again he does have long-term sleep issues.   Review of Systems   BP 135/85   Pulse 82   Ht 5\' 9"  (1.753 m)   Wt 214 lb (97.1 kg)   SpO2 98%   BMI 31.60 kg/m     No Known Allergies  Past Medical History:  Diagnosis Date  . Diverticulitis   . Prostate calculus     Past Surgical History:  Procedure Laterality Date  . LUMBAR SPINE SURGERY  20012   disc issues.   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 Marital status: Married    Spouse name: Not on file  . Number of children: Not on file  . Years of education: Not on file  . Highest education level: Not on file  Occupational History  . Not on file   Social Needs  . Financial resource strain: Not on file  . Food insecurity:    Worry: Not on file    Inability: Not on file  . Transportation needs:    Medical: Not on file    Non-medical: Not on file  Tobacco Use  . Smoking status: Former Research scientist (life sciences)  . Smokeless tobacco: Former Network engineer and Sexual Activity  . Alcohol use: Yes    Alcohol/week: 3.0 - 6.0 standard drinks    Types: 3 - 6 Standard drinks or equivalent per week    Comment: OCC    BEER  . Drug use: Yes    Types: Cocaine    Comment: SEVERAL DIFF DRUGS AS TEEN EARLY 20S,  NONE SINCE  . Sexual activity: Not on file  Lifestyle  . Physical activity:    Days per week: Not on file    Minutes per session: Not on file  . Stress: Not on file  Relationships  . Social connections:    Talks on phone: Not on file    Gets together: Not on file    Attends religious service: Not on file    Active member  of club or organization: Not on file    Attends meetings of clubs or organizations: Not on file    Relationship status: Not on file  . Intimate partner violence:    Fear of current or ex partner: Not on file    Emotionally abused: Not on file    Physically abused: Not on file    Forced sexual activity: Not on file  Other Topics Concern  . Not on file  Social History Narrative   He is very active.      Family History  Problem Relation Age of Onset  . Alcohol abuse Unknown        parents  . Depression Unknown        parents  . Hypertension Unknown        grandparents  . Hyperlipidemia Unknown        grandparents  . Heart disease Unknown     Outpatient Encounter Medications as of 10/08/2018  Medication Sig  . aspirin 81 MG EC tablet TAKE 1 TABLET (81 MG TOTAL) BY MOUTH DAILY.  Marland Kitchen Cyanocobalamin (VITAMIN B12) 1000 MCG TBCR Take by mouth.  . Multiple Vitamin (MULTIVITAMIN) tablet Take 1 tablet by mouth daily.  . [DISCONTINUED] amoxicillin-clavulanate (AUGMENTIN) 875-125 MG tablet Take 1 tablet by mouth 2 (two)  times daily.  . [DISCONTINUED] aspirin 81 MG EC tablet Take 81 mg by mouth daily.   No facility-administered encounter medications on file as of 10/08/2018.          Objective:   Physical Exam  Constitutional: He is oriented to person, place, and time. He appears well-developed and well-nourished.  HENT:  Head: Normocephalic and atraumatic.  Right Ear: External ear normal.  Left Ear: External ear normal.  Nose: Nose normal.  Mouth/Throat: Oropharynx is clear and moist.  Eyes: Conjunctivae are normal.  Cardiovascular: Normal rate, regular rhythm, normal heart sounds and intact distal pulses.  Pulmonary/Chest: Effort normal and breath sounds normal.  Musculoskeletal: Normal range of motion. He exhibits no edema.  Neurological: He is alert and oriented to person, place, and time. He has normal reflexes.  Skin: Skin is warm and dry.  Psychiatric: He has a normal mood and affect. His behavior is normal. Judgment and thought content normal.        Assessment & Plan:  Atypical chest pain-suspect probably not cardiac.  EKG performed today.  He reports he actually had a stress test may be 10+ years ago that was normal.  His LDL cholesterol was mildly elevated about 3 years ago so we will get up-to-date labs to better risk stratify him for heart disease along with his family history.  We will also check for anemia as well as thyroid disorder.  KG today shows rate of 75 bpm, normal sinus rhythm with no acute ST-T wave changes.  Though he has poor R wave progression in the lateral leads and old Q waves in what looks like V1 and V2 and may be even lead III consistent with possible old anterior infarct.  Looks like the old Q waves in lead III was not present in 2017 some to go ahead and refer him on to cardiology.  Fatigue-again we will check for thyroid disorder anemia as well as testosterone level. Low T questionnaire score of  8/10 - Yes.  Will check testosterone level.    Mild ED sxs - ED  questionnaire score of 20.  Which is significant may be for some mild erectile dysfunction.  Come planning on coming  back for physical in about 2 weeks.

## 2018-10-10 LAB — LIPID PANEL
CHOLESTEROL: 253 mg/dL — AB (ref <200)
HDL: 53 mg/dL (ref >40)
LDL Cholesterol (Calc): 159 mg/dL (calc) — ABNORMAL HIGH
Non-HDL Cholesterol (Calc): 200 mg/dL (calc) — ABNORMAL HIGH (ref <130)
TRIGLYCERIDES: 241 mg/dL — AB (ref <150)
Total CHOL/HDL Ratio: 4.8 (calc) (ref <5.0)

## 2018-10-10 LAB — COMPLETE METABOLIC PANEL WITH GFR
AG RATIO: 2.1 (calc) (ref 1.0–2.5)
ALKALINE PHOSPHATASE (APISO): 47 U/L (ref 40–115)
ALT: 48 U/L — AB (ref 9–46)
AST: 23 U/L (ref 10–40)
Albumin: 4.7 g/dL (ref 3.6–5.1)
BUN: 15 mg/dL (ref 7–25)
CHLORIDE: 102 mmol/L (ref 98–110)
CO2: 28 mmol/L (ref 20–32)
Calcium: 9.7 mg/dL (ref 8.6–10.3)
Creat: 1.04 mg/dL (ref 0.60–1.35)
GFR, Est African American: 101 mL/min/{1.73_m2} (ref >=60)
GFR, Est Non African American: 88 mL/min/{1.73_m2} (ref >=60)
GLOBULIN: 2.2 g/dL (ref 1.9–3.7)
Glucose, Bld: 96 mg/dL (ref 65–99)
POTASSIUM: 4.9 mmol/L (ref 3.5–5.3)
Sodium: 139 mmol/L (ref 135–146)
Total Bilirubin: 0.8 mg/dL (ref 0.2–1.2)
Total Protein: 6.9 g/dL (ref 6.1–8.1)

## 2018-10-10 LAB — CBC
HEMATOCRIT: 43.3 % (ref 38.5–50.0)
HEMOGLOBIN: 15.1 g/dL (ref 13.2–17.1)
MCH: 30.5 pg (ref 27.0–33.0)
MCHC: 34.9 g/dL (ref 32.0–36.0)
MCV: 87.5 fL (ref 80.0–100.0)
MPV: 10.8 fL (ref 7.5–12.5)
PLATELETS: 180 10*3/uL (ref 140–400)
RBC: 4.95 10*6/uL (ref 4.20–5.80)
RDW: 13.6 % (ref 11.0–15.0)
WBC: 4.5 10*3/uL (ref 3.8–10.8)

## 2018-10-10 LAB — TSH: TSH: 1.46 mIU/L (ref 0.40–4.50)

## 2018-10-10 LAB — TESTOSTERONE: TESTOSTERONE: 364 ng/dL (ref 250–827)

## 2018-10-11 ENCOUNTER — Other Ambulatory Visit: Payer: Self-pay | Admitting: *Deleted

## 2018-10-11 DIAGNOSIS — R748 Abnormal levels of other serum enzymes: Secondary | ICD-10-CM

## 2018-10-24 ENCOUNTER — Ambulatory Visit: Payer: 59 | Admitting: Cardiology

## 2018-10-28 DIAGNOSIS — R0602 Shortness of breath: Secondary | ICD-10-CM

## 2018-10-28 DIAGNOSIS — R079 Chest pain, unspecified: Secondary | ICD-10-CM | POA: Insufficient documentation

## 2018-10-28 HISTORY — DX: Shortness of breath: R06.02

## 2018-10-28 HISTORY — DX: Chest pain, unspecified: R07.9

## 2018-10-29 ENCOUNTER — Encounter: Payer: Self-pay | Admitting: Cardiology

## 2018-10-29 ENCOUNTER — Ambulatory Visit (INDEPENDENT_AMBULATORY_CARE_PROVIDER_SITE_OTHER): Payer: 59 | Admitting: Family Medicine

## 2018-10-29 ENCOUNTER — Ambulatory Visit (INDEPENDENT_AMBULATORY_CARE_PROVIDER_SITE_OTHER): Payer: 59 | Admitting: Cardiology

## 2018-10-29 ENCOUNTER — Encounter: Payer: Self-pay | Admitting: Family Medicine

## 2018-10-29 VITALS — BP 132/88 | HR 79 | Ht 68.5 in | Wt 210.0 lb

## 2018-10-29 DIAGNOSIS — R079 Chest pain, unspecified: Secondary | ICD-10-CM

## 2018-10-29 DIAGNOSIS — R0602 Shortness of breath: Secondary | ICD-10-CM | POA: Diagnosis not present

## 2018-10-29 DIAGNOSIS — Z0000 Encounter for general adult medical examination without abnormal findings: Secondary | ICD-10-CM

## 2018-10-29 DIAGNOSIS — E782 Mixed hyperlipidemia: Secondary | ICD-10-CM | POA: Diagnosis not present

## 2018-10-29 MED ORDER — ROSUVASTATIN CALCIUM 10 MG PO TABS: 10.0000 mg | ORAL_TABLET | Freq: Every day | ORAL | 6 refills | Status: DC

## 2018-10-29 NOTE — Patient Instructions (Signed)
Medication Instructions:  Your physician has recommended you make the following change in your medication:   START rosuvastatin (crestor) 10 mg: Take 1 tablet daily  If you need a refill on your cardiac medications before your next appointment, please call your pharmacy.   Lab work: Your physician recommends that you return for lab work in 1 month: lipid panel, Lipoprotein (LPa), CMP. Please return to our office for lab work, no appointment needed. Please fast beforehand.   If you have labs (blood work) drawn today and your tests are completely normal, you will receive your results only by: Marland Kitchen MyChart Message (if you have MyChart) OR . A paper copy in the mail If you have any lab test that is abnormal or we need to change your treatment, we will call you to review the results.  Testing/Procedures: You had an EKG today.   Your physician has requested that you have a stress echocardiogram. For further information please visit [email or web address]. Please follow instruction sheet as given.  Follow-Up: At [facility], you and your health needs are our priority.  As part of our continuing mission to provide you with exceptional heart care, we have created designated Provider Care Teams.  These Care Teams include your primary Cardiologist (physician) and Advanced Practice Providers (APPs -  Physician Assistants and Nurse Practitioners) who all work together to provide you with the care you need, when you need it. You will need a follow up appointment as needed if stress echocardiogram is normal.      Exercise Stress Electrocardiogram An exercise stress electrocardiogram is a test to check how blood flows to your heart. It is done to find areas of poor blood flow. You will need to walk on a treadmill for this test. The electrocardiogram will record your heartbeat when you are at rest and when you are exercising. What happens before the procedure?  Do not have drinks with caffeine or foods  with caffeine for 24 hours before the test, or as told by your doctor. This includes coffee, tea (even decaf tea), sodas, chocolate, and cocoa.  Follow your doctor's instructions about eating and drinking before the test.  Ask your doctor what medicines you should or should not take before the test. Take your medicines with water unless told by your doctor not to.  If you use an inhaler, bring it with you to the test.  Bring a snack to eat after the test.  Do not  smoke for 4 hours before the test.  Do not put lotions, powders, creams, or oils on your chest before the test.  Wear comfortable shoes and clothing. What happens during the procedure?  You will have patches put on your chest. Small areas of your chest may need to be shaved. Wires will be connected to the patches.  Your heart rate will be watched while you are resting and while you are exercising.  You will walk on the treadmill. The treadmill will slowly get faster to raise your heart rate.  The test will take about 1-2 hours. What happens after the procedure?  Your heart rate and blood pressure will be watched after the test.  You may return to your normal diet, activities, and medicines or as told by your doctor. This information is not intended to replace advice given to you by your health care provider. Make sure you discuss any questions you have with your health care provider. Document Released: 05/15/2008 Document Revised: 07/26/2016 Document Reviewed: 08/04/2013 Elsevier Interactive Patient Education  2018 Elsevier Inc.    Rosuvastatin Tablets What is this medicine? ROSUVASTATIN (roe SOO va sta tin) is known as a HMG-CoA reductase inhibitor or 'statin'. It lowers cholesterol and triglycerides in the blood. This drug may also reduce the risk of heart attack, stroke, or other health problems in patients with risk factors for heart disease. Diet and lifestyle changes are often used with this drug. This medicine  may be used for other purposes; ask your health care provider or pharmacist if you have questions. COMMON BRAND NAME(S): Crestor What should I tell my health care provider before I take this medicine? They need to know if you have any of these conditions: -frequently drink alcoholic beverages -kidney disease -liver disease -muscle aches or weakness -other medical condition -an unusual or allergic reaction to rosuvastatin, other medicines, foods, dyes, or preservatives -pregnant or trying to get pregnant -breast-feeding How should I use this medicine? Take this medicine by mouth with a glass of water. Follow the directions on the prescription label. Do not cut, crush or chew this medicine. You can take this medicine with or without food. Take your doses at regular intervals. Do not take your medicine more often than directed. Talk to your pediatrician regarding the use of this medicine in children. While this drug may be prescribed for children as young as 3 years old for selected conditions, precautions do apply. Overdosage: If you think you have taken too much of this medicine contact a poison control center or emergency room at once. NOTE: This medicine is only for you. Do not share this medicine with others. What if I miss a dose? If you miss a dose, take it as soon as you can. Do not take 2 doses within 12 hours of each other. If there are less than 12 hours until your next dose, take only that dose. Do not take double or extra doses. What may interact with this medicine? Do not take this medicine with any of the following medications: -herbal medicines like red yeast rice This medicine may also interact with the following medications: -alcohol -antacids containing aluminum hydroxide or magnesium hydroxide -cyclosporine -other medicines for high cholesterol -some medicines for HIV infection -warfarin This list may not describe all possible interactions. Give your health care provider  a list of all the medicines, herbs, non-prescription drugs, or dietary supplements you use. Also tell them if you smoke, drink alcohol, or use illegal drugs. Some items may interact with your medicine. What should I watch for while using this medicine? Visit your doctor or health care professional for regular check-ups. You may need regular tests to make sure your liver is working properly. Tell your doctor or health care professional right away if you get any unexplained muscle pain, tenderness, or weakness, especially if you also have a fever and tiredness. Your doctor or health care professional may tell you to stop taking this medicine if you develop muscle problems. If your muscle problems do not go away after stopping this medicine, contact your health care professional. This medicine may affect blood sugar levels. If you have diabetes, check with your doctor or health care professional before you change your diet or the dose of your diabetic medicine. Avoid taking antacids containing aluminum, calcium or magnesium within 2 hours of taking this medicine. This drug is only part of a total heart-health program. Your doctor or a dietician can suggest a low-cholesterol and low-fat diet to help. Avoid alcohol and smoking, and keep a proper exercise schedule.  Do not use this drug if you are pregnant or breast-feeding. Serious side effects to an unborn child or to an infant are possible. Talk to your doctor or pharmacist for more information. What side effects may I notice from receiving this medicine? Side effects that you should report to your doctor or health care professional as soon as possible: -allergic reactions like skin rash, itching or hives, swelling of the face, lips, or tongue -dark urine -fever -joint pain -muscle cramps, pain -redness, blistering, peeling or loosening of the skin, including inside the mouth -trouble passing urine or change in the amount of urine -unusually weak or  tired -yellowing of the eyes or skin Side effects that usually do not require medical attention (report to your doctor or health care professional if they continue or are bothersome): -constipation -heartburn -nausea -stomach gas, pain, upset This list may not describe all possible side effects. Call your doctor for medical advice about side effects. You may report side effects to FDA at 1-800-FDA-1088. Where should I keep my medicine? Keep out of the reach of children. Store at room temperature between 20 and 25 degrees C (68 and 77 degrees F). Keep container tightly closed (protect from moisture). Throw away any unused medicine after the expiration date. NOTE: This sheet is a summary. It may not cover all possible information. If you have questions about this medicine, talk to your doctor, pharmacist, or health care provider.  2018 Elsevier/Gold Standard (2015-05-13 13:33:08)

## 2018-10-29 NOTE — Patient Instructions (Addendum)
Go to lab in Jan or Feb to recheck your liver and lipids.   Health Maintenance, Male A healthy lifestyle and preventive care is important for your health and wellness. Ask your health care provider about what schedule of regular examinations is right for you. What should I know about weight and diet? Eat a Healthy Diet  Eat plenty of vegetables, fruits, whole grains, low-fat dairy products, and lean protein.  Do not eat a lot of foods high in solid fats, added sugars, or salt.  Maintain a Healthy Weight Regular exercise can help you achieve or maintain a healthy weight. You should:  Do at least 150 minutes of exercise each week. The exercise should increase your heart rate and make you sweat (moderate-intensity exercise).  Do strength-training exercises at least twice a week.  Watch Your Levels of Cholesterol and Blood Lipids  Have your blood tested for lipids and cholesterol every 5 years starting at 43 years of age. If you are at high risk for heart disease, you should start having your blood tested when you are 43 years old. You may need to have your cholesterol levels checked more often if: ? Your lipid or cholesterol levels are high. ? You are older than 43 years of age. ? You are at high risk for heart disease.  What should I know about cancer screening? Many types of cancers can be detected early and may often be prevented. Lung Cancer  You should be screened every year for lung cancer if: ? You are a current smoker who has smoked for at least 30 years. ? You are a former smoker who has quit within the past 15 years.  Talk to your health care provider about your screening options, when you should start screening, and how often you should be screened.  Colorectal Cancer  Routine colorectal cancer screening usually begins at 43 years of age and should be repeated every 5-10 years until you are 43 years old. You may need to be screened more often if early forms of  precancerous polyps or small growths are found. Your health care provider may recommend screening at an earlier age if you have risk factors for colon cancer.  Your health care provider may recommend using home test kits to check for hidden blood in the stool.  A small camera at the end of a tube can be used to examine your colon (sigmoidoscopy or colonoscopy). This checks for the earliest forms of colorectal cancer.  Prostate and Testicular Cancer  Depending on your age and overall health, your health care provider may do certain tests to screen for prostate and testicular cancer.  Talk to your health care provider about any symptoms or concerns you have about testicular or prostate cancer.  Skin Cancer  Check your skin from head to toe regularly.  Tell your health care provider about any new moles or changes in moles, especially if: ? There is a change in a mole's size, shape, or color. ? You have a mole that is larger than a pencil eraser.  Always use sunscreen. Apply sunscreen liberally and repeat throughout the day.  Protect yourself by wearing long sleeves, pants, a wide-brimmed hat, and sunglasses when outside.  What should I know about heart disease, diabetes, and high blood pressure?  If you are 59-71 years of age, have your blood pressure checked every 3-5 years. If you are 78 years of age or older, have your blood pressure checked every year. You should have your  blood pressure measured twice-once when you are at a hospital or clinic, and once when you are not at a hospital or clinic. Record the average of the two measurements. To check your blood pressure when you are not at a hospital or clinic, you can use: ? An automated blood pressure machine at a pharmacy. ? A home blood pressure monitor.  Talk to your health care provider about your target blood pressure.  If you are between 38-31 years old, ask your health care provider if you should take aspirin to prevent heart  disease.  Have regular diabetes screenings by checking your fasting blood sugar level. ? If you are at a normal weight and have a low risk for diabetes, have this test once every three years after the age of 45. ? If you are overweight and have a high risk for diabetes, consider being tested at a younger age or more often.  A one-time screening for abdominal aortic aneurysm (AAA) by ultrasound is recommended for men aged 47-75 years who are current or former smokers. What should I know about preventing infection? Hepatitis B If you have a higher risk for hepatitis B, you should be screened for this virus. Talk with your health care provider to find out if you are at risk for hepatitis B infection. Hepatitis C Blood testing is recommended for:  Everyone born from [birth year] through 1966.  Anyone with known risk factors for hepatitis C.  Sexually Transmitted Diseases (STDs)  You should be screened each year for STDs including gonorrhea and chlamydia if: ? You are sexually active and are younger than 43 years of age. ? You are older than 43 years of age and your health care provider tells you that you are at risk for this type of infection. ? Your sexual activity has changed since you were last screened and you are at an increased risk for chlamydia or gonorrhea. Ask your health care provider if you are at risk.  Talk with your health care provider about whether you are at high risk of being infected with HIV. Your health care provider may recommend a prescription medicine to help prevent HIV infection.  What else can I do?  Schedule regular health, dental, and eye exams.  Stay current with your vaccines (immunizations).  Do not use any tobacco products, such as cigarettes, chewing tobacco, and e-cigarettes. If you need help quitting, ask your health care provider.  Limit alcohol intake to no more than 2 drinks per day. One drink equals 12 ounces of beer, 5 ounces of wine, or 1 ounces of  hard liquor.  Do not use street drugs.  Do not share needles.  Ask your health care provider for help if you need support or information about quitting drugs.  Tell your health care provider if you often feel depressed.  Tell your health care provider if you have ever been abused or do not feel safe at home. This information is not intended to replace advice given to you by your health care provider. Make sure you discuss any questions you have with your health care provider. Document Released: 05/25/2008 Document Revised: 07/26/2016 Document Reviewed: 08/31/2015 Elsevier Interactive Patient Education  [employer].

## 2018-10-29 NOTE — Progress Notes (Signed)
Cardiology Office Note:    Date:  10/29/2018   ID:  Andrew Frost, DOB Aug 09, 1976, MRN [id number]  PCP:  Hali Marry, MD  Cardiologist:  Shirlee More, MD    Referring MD: Hali Marry, *    ASSESSMENT:    1. Chest pain, unspecified type   2. Mixed hyperlipidemia    PLAN:    In order of problems listed above:  1. His chest pain symptoms are atypical but at increased cardiovascular risk with male sex and significant dyslipidemia.  For further evaluation undergo a stress echo if abnormal and/or high risk markers he benefit from angiography and revascularization if normal I would not do further evaluation like angiography however I would still place him on lipid-lowering therapy.  After review of options and benefits he agrees to accept a low-dose of a generic statin with follow-up labs in 1 month. 2. Initiate lipid-lowering therapy   Next appointment: PRN   Medication Adjustments/Labs and Tests Ordered: Current medicines are reviewed at length with the patient today.  Concerns regarding medicines are outlined above.  No orders of the defined types were placed in this encounter.  No orders of the defined types were placed in this encounter.   Chief Complaint  Patient presents with  . Chest Pain    History of Present Illness:    Andrew Frost is a 42 y.o. male with mixed hyperlipidemia referred by Hali Marry, MD for evaluation of chest pain after office visit 10/08/18.  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  Recently he was under increased stress and the symptoms intensify he is able to manage it by rest and stress reduction in episodes last a few minutes recently is try to reduce stress in his personal life and has had  no recurrence since his office visit with his PCP.  None the symptoms are exertional and never wake him from sleep at night there is no radiation nausea vomiting or diaphoresis the pain is mild to moderate and not pleuritic.  He has no history of congenital rheumatic heart disease in the realm of 15 years ago he had a stress echo performed which was normal.  He has significant dyslipidemia with a non-HDL cholesterol of 200 and after discussion agrees to accept lipid-lowering therapy and I will place him on a low-dose of rosuvastatin with follow-up lipids CMP and LP(a) in 1 month. Past Medical History:  Diagnosis Date  . Avascular necrosis of bone of left hip ([place]) 06/04/2016  . Avascular necrosis of bones of both hips ([place]) 01/24/2016  . Chest pain 10/28/2018  . Diverticulitis   . Hyperlipidemia 04/26/2015  . Prostate calculus   . Shortness of breath 10/28/2018    Past Surgical History:  Procedure Laterality Date  . LUMBAR SPINE SURGERY  20012   disc issues.   Marland Kitchen TOTAL HIP ARTHROPLASTY Left 06/05/2016   Procedure: TOTAL HIP ARTHROPLASTY ANTERIOR APPROACH;  Surgeon: Frederik Pear, MD;  Location: [facility];  Service: Orthopedics;  Laterality: Left;  Marland Kitchen VASECTOMY  12/2008    Current Medications: Current Meds  Medication Sig  . aspirin 81 MG EC tablet TAKE 1 TABLET (81 MG TOTAL) BY MOUTH DAILY.  Marland Kitchen Cyanocobalamin (VITAMIN B12) 1000 MCG TBCR Take 1 tablet by mouth daily.   Marland Kitchen  MEGARED OMEGA-3 KRILL OIL 500 MG CAPS Take 1,000 mg by mouth daily.   . Multiple Vitamin (MULTIVITAMIN) tablet Take 1 tablet by mouth daily.     Allergies:   Prednisone   Social History   Socioeconomic History  . Marital status: Married    Spouse name: Not on file  . Number of children: Not on file  . Years of education: Not on file  . Highest education level: Not on file  Occupational History  . Not on file  Social Needs  . Financial resource strain: Not on file  . Food insecurity:    Worry: Not on file    Inability:  Not on file  . Transportation needs:    Medical: Not on file    Non-medical: Not on file  Tobacco Use  . Smoking status: Former Smoker    Packs/day: 0.50    Years: 5.00    Pack years: 2.50    Types: Cigarettes    Last attempt to quit: 2000    Years since quitting: 19.8  . Smokeless tobacco: Former Network engineer and Sexual Activity  . Alcohol use: Yes    Alcohol/week: 1.0 standard drinks    Types: 1 Cans of beer per week    Comment: 1 beer every few days  . Drug use: Not Currently    Types: Cocaine    Comment: SEVERAL DIFF DRUGS AS TEEN EARLY 20S,  NONE SINCE  . Sexual activity: Not on file  Lifestyle  . Physical activity:    Days per week: Not on file    Minutes per session: Not on file  . Stress: Not on file  Relationships  . Social connections:    Talks on phone: Not on file    Gets together: Not on file    Attends religious service: Not on file    Active member of club or organization: Not on file    Attends meetings of clubs or organizations: Not on file    Relationship status: Not on file  Other Topics Concern  . Not on file  Social History Narrative   He is very active.       Family History: The patient's family history includes Alcohol abuse in his unknown relative; Depression in his unknown relative; Heart attack in his paternal grandfather; Heart disease in his unknown relative; Hyperlipidemia in his unknown relative; Hypertension in his unknown relative; Valvular heart disease in his maternal grandmother. ROS:   Please see the history of present illness.    All other systems reviewed and are negative.  EKGs/Labs/Other Studies Reviewed:    The following studies were reviewed today:  EKG:  EKG ordered today.  The ekg ordered today demonstrates Burns normal  Recent Labs: 10/09/2018: ALT 48; BUN 15; Creat 1.04; Hemoglobin 15.1; Platelets 180; Potassium 4.9; Sodium 139; TSH 1.46  Recent Lipid Panel    Component Value Date/Time   CHOL 253 (H) 10/09/2018  0740   TRIG 241 (H) 10/09/2018 0740   HDL 53 10/09/2018 0740   CHOLHDL 4.8 10/09/2018 0740   VLDL 22 04/23/2015 0918   LDLCALC 159 (H) 10/09/2018 0740    Ref Range & Units 2wk ago (10/09/18) 11yr ago (04/23/15) 12yr ago (04/09/15)  Cholesterol <200 mg/dL 253High   214High  R, CM 204Abnormal  R  HDL >40 mg/dL 53  57 R, CM 53 R  Triglycerides <150 mg/dL 241High   110  102 R  Comment: .  If a non-fasting specimen was collected, consider  repeat triglyceride testing on a fasting specimen  if clinically indicated.  Yates Decamp et al. J. of Clin. Lipidol. 2952;8:413-244.  Marland Kitchen   LDL Cholesterol (Calc) mg/dL (calc) 159High      Comment: Reference range: <100  .  Desirable range <100 mg/dL for primary prevention;   <70 mg/dL for patients with CHD or diabetic patients  with > or = 2 CHD risk factors.  Marland Kitchen  LDL-C is now calculated using the Martin-Hopkins  calculation, which is a validated novel method providing  better accuracy than the Friedewald equation in the  estimation of LDL-C.  Cresenciano Genre et al. Annamaria Helling. 0102;725(36): 2061-2068  (http://education.[facility].com/faq/FAQ164)   Total CHOL/HDL Ratio <5.0 (calc) 4.8  3.8 R   Non-HDL Cholesterol (Calc) <130 mg/dL (calc) 200High      Comment: For patients with diabetes plus 1 major ASCVD risk  factor, treating to a non-HDL-C goal of <100 mg/dL  (LDL-C of <70 mg/dL) is considered a therapeutic  option.      Physical Exam:    VS:  BP 132/88 (BP Location: Left Arm, Patient Position: Sitting, Cuff Size: Large)   Pulse 79   Ht 5' 8.5" (1.74 m)   Wt 210 lb (95.3 kg)   SpO2 97%   BMI 31.47 kg/m     Wt Readings from Last 3 Encounters:  10/29/18 210 lb (95.3 kg)  10/29/18 207 lb (93.9 kg)  10/08/18 214 lb (97.1 kg)     GEN:  Well nourished, well developed in no acute distress HEENT: Normal NECK: No JVD; No carotid bruits LYMPHATICS: No lymphadenopathy CARDIAC: RRR, no murmurs, rubs, gallops RESPIRATORY:  Clear to auscultation  without rales, wheezing or rhonchi  ABDOMEN: Soft, non-tender, non-distended MUSCULOSKELETAL:  No edema; No deformity  SKIN: Warm and dry NEUROLOGIC:  Alert and oriented x 3 PSYCHIATRIC:  Normal affect    Signed, Shirlee More, MD  10/29/2018 1:41 PM    [facility]

## 2018-10-29 NOTE — Assessment & Plan Note (Signed)
Go to lab in Jan or Feb to recheck your liver and lipids.  Otherwise labs are up-to-date.  Vaccines are up-to-date as well.

## 2018-10-29 NOTE — Progress Notes (Signed)
Established Patient Office Visit - CPE  Subjective:  Patient ID: Andrew Frost, male    DOB: October 27, 1976  Age: 42 y.o. MRN: [id number]  CC:  Chief Complaint  Patient presents with  . Annual Exam     HPI Andrew Frost presents for CPE.  He says his chest pain that he came in for recently has improved in fact at some is completely gone.  He says he is really worked on stress reduction and changing his diet.  He is also started taking a Mega read to help with his lipid levels. He is exercising some.    Past Medical History:  Diagnosis Date  . Avascular necrosis of bone of left hip ([place]) 06/04/2016  . Avascular necrosis of bones of both hips ([place]) 01/24/2016  . Chest pain 10/28/2018  . Diverticulitis   . Hyperlipidemia 04/26/2015  . Prostate calculus   . Shortness of breath 10/28/2018    Past Surgical History:  Procedure Laterality Date  . LUMBAR SPINE SURGERY  20012   disc issues.   Marland Kitchen TOTAL HIP ARTHROPLASTY Left 06/05/2016   Procedure: TOTAL HIP ARTHROPLASTY ANTERIOR APPROACH;  Surgeon: Andrew Pear, MD;  Location: [facility];  Service: Orthopedics;  Laterality: Left;  Marland Kitchen VASECTOMY  12/2008    Family History  Problem Relation Age of Onset  . Alcohol abuse Unknown        parents  . Depression Unknown        parents  . Hypertension Unknown        grandparents  . Hyperlipidemia Unknown        grandparents  . Heart disease Unknown     Social History   Socioeconomic History  . Marital status: Married    Spouse name: Not on file  . Number of children: Not on file  . Years of education: Not on file  . Highest education level: Not on file  Occupational History  . Not on file  Social Needs  . Financial resource strain: Not on file  . Food insecurity:    Worry: Not on file    Inability: Not on file  . Transportation needs:    Medical: Not on file    Non-medical: Not on file  Tobacco Use  . Smoking status: Former Research scientist (life sciences)  . Smokeless tobacco: Former Network engineer and  Sexual Activity  . Alcohol use: Yes    Alcohol/week: 3.0 - 6.0 standard drinks    Types: 3 - 6 Standard drinks or equivalent per week    Comment: OCC    BEER  . Drug use: Yes    Types: Cocaine    Comment: SEVERAL DIFF DRUGS AS TEEN EARLY 20S,  NONE SINCE  . Sexual activity: Not on file  Lifestyle  . Physical activity:    Days per week: Not on file    Minutes per session: Not on file  . Stress: Not on file  Relationships  . Social connections:    Talks on phone: Not on file    Gets together: Not on file    Attends religious service: Not on file    Active member of club or organization: Not on file    Attends meetings of clubs or organizations: Not on file    Relationship status: Not on file  . Intimate partner violence:    Fear of current or ex partner: Not on file    Emotionally abused: Not on file    Physically abused: Not on file  Forced sexual activity: Not on file  Other Topics Concern  . Not on file  Social History Narrative   He is very active.      Outpatient Medications Prior to Visit  Medication Sig Dispense Refill  . aspirin 81 MG EC tablet TAKE 1 TABLET (81 MG TOTAL) BY MOUTH DAILY. 90 tablet 3  . Cyanocobalamin (VITAMIN B12) 1000 MCG TBCR Take by mouth.    Marland Kitchen MEGARED OMEGA-3 KRILL OIL 500 MG CAPS Take 500 mg by mouth.    . Multiple Vitamin (MULTIVITAMIN) tablet Take 1 tablet by mouth daily.     No facility-administered medications prior to visit.     No Known Allergies  ROS Review of Systems    Objective:    Physical Exam  BP 134/74   Pulse 79   Ht 5' 8.9" (1.75 m)   Wt 207 lb (93.9 kg)   SpO2 100%   BMI 30.66 kg/m  Wt Readings from Last 3 Encounters:  10/29/18 207 lb (93.9 kg)  10/08/18 214 lb (97.1 kg)  12/14/16 200 lb (90.7 kg)     Health Maintenance Due  Topic Date Due  . HIV Screening  02/02/1990    There are no preventive care reminders to display for this patient.  Lab Results  Component Value Date   TSH 1.46 10/09/2018    Lab Results  Component Value Date   WBC 4.5 10/09/2018   HGB 15.1 10/09/2018   HCT 43.3 10/09/2018   MCV 87.5 10/09/2018   PLT 180 10/09/2018   Lab Results  Component Value Date   NA 139 10/09/2018   K 4.9 10/09/2018   CO2 28 10/09/2018   GLUCOSE 96 10/09/2018   BUN 15 10/09/2018   CREATININE 1.04 10/09/2018   BILITOT 0.8 10/09/2018   ALKPHOS 48 01/21/2016   AST 23 10/09/2018   ALT 48 (H) 10/09/2018   PROT 6.9 10/09/2018   ALBUMIN 4.4 01/21/2016   CALCIUM 9.7 10/09/2018   ANIONGAP 7 06/06/2016   Lab Results  Component Value Date   CHOL 253 (H) 10/09/2018   Lab Results  Component Value Date   HDL 53 10/09/2018   Lab Results  Component Value Date   LDLCALC 159 (H) 10/09/2018   Lab Results  Component Value Date   TRIG 241 (H) 10/09/2018   Lab Results  Component Value Date   CHOLHDL 4.8 10/09/2018   Lab Results  Component Value Date   HGBA1C 5.4 04/09/2015      Assessment & Plan:   Problem List Items Addressed This Visit      Other   Wellness examination    Go to lab in Jan or Feb to recheck your liver and lipids.  Otherwise labs are up-to-date.  Vaccines are up-to-date as well.        Labs are up to date. Vaccine are up to date.   No orders of the defined types were placed in this encounter.   Follow-up: Return in about 1 year (around 10/30/2019) for wellness exam .    Beatrice Lecher, MD

## 2018-11-05 ENCOUNTER — Ambulatory Visit (HOSPITAL_BASED_OUTPATIENT_CLINIC_OR_DEPARTMENT_OTHER): Payer: 59

## 2018-12-06 ENCOUNTER — Ambulatory Visit (INDEPENDENT_AMBULATORY_CARE_PROVIDER_SITE_OTHER): Payer: 59 | Admitting: Family Medicine

## 2018-12-06 ENCOUNTER — Encounter: Payer: Self-pay | Admitting: Family Medicine

## 2018-12-06 VITALS — BP 138/88 | HR 93 | Ht 69.0 in | Wt 213.0 lb

## 2018-12-06 DIAGNOSIS — R748 Abnormal levels of other serum enzymes: Secondary | ICD-10-CM | POA: Diagnosis not present

## 2018-12-06 DIAGNOSIS — F4321 Adjustment disorder with depressed mood: Secondary | ICD-10-CM | POA: Diagnosis not present

## 2018-12-06 DIAGNOSIS — F5101 Primary insomnia: Secondary | ICD-10-CM

## 2018-12-06 MED ORDER — ESCITALOPRAM OXALATE 5 MG PO TABS: 5.0000 mg | ORAL_TABLET | Freq: Every day | ORAL | 0 refills | Status: DC

## 2018-12-06 NOTE — Patient Instructions (Signed)
Adjustment Disorder, Adult Adjustment disorder is a group of symptoms that can develop after a stressful life event, such as the loss of a job or serious physical illness. The symptoms can affect how you feel, think, and act. They may interfere with your relationships. Adjustment disorder increases your risk of suicide and substance abuse. If this disorder is not managed early, it can develop into a more serious condition, such as major depressive disorder or post-traumatic stress disorder. What are the causes? This condition happens when you have trouble recovering from or coping with a stressful life event. What increases the risk? You are more likely to develop this condition if:  You have had depression or anxiety.  You are being treated for a long-term (chronic) illness.  You are being treated for an illness that cannot be cured (terminal illness).  You have a family history of mental illness. What are the signs or symptoms? Symptoms of this condition include:  Extreme trouble doing daily tasks, such as going to work.  Sadness, depression, or crying spells.  Worrying a lot.  Loss of enjoyment.  Change in appetite or weight.  Feelings of loss or hopelessness.  Thoughts of suicide.  Anxiety, worry, or nervousness.  Trouble sleeping.  Avoiding family and friends.  Fighting or vandalism.  Complaining of feeling sick without being ill.  Feeling dazed or disconnected.  Nightmares.  Trouble sleeping.  Irritability.  Reckless driving.  Poor work performance.  Ignoring bills. Symptoms of this condition start within three months of the stressful event. They do not last more than six months, unless the stressful circumstances last longer. Normal grieving after the death of a loved one is not a symptom of this condition. How is this diagnosed? To diagnose this condition, your health care provider will ask about what has happened in your life and how it has affected  you. He or she may also ask about your medical history and your use of medicines, alcohol, and other substances. Your health care provider may do a physical exam and order lab tests or other studies. You may be referred to a mental health specialist. How is this treated? Treatment options for this condition include:  Counseling or talk therapy. Talk therapy is usually provided by mental health specialists.  Medicines. Certain medicines may help with depression, anxiety, and sleep.  Support groups. These offer emotional support, advice, and guidance. They are made up of people who have had similar experiences.  Observation and time. This is sometimes called "watchful waiting." In this treatment, health care providers monitor your health and behavior without other treatment. Adjustment disorder sometimes gets better on its own with time. Follow these instructions at home:  Take over-the-counter and prescription medicines only as told by your health care provider.  Keep all follow-up visits as told by your health care provider. This is important. Contact a health care provider if:  Your symptoms do not improve in six months.  Your symptoms get worse. Get help right away if:  You have serious thoughts about hurting yourself or someone else. If you ever feel like you may hurt yourself or others, or have thoughts about taking your own life, get help right away. You can go to your nearest emergency department or call:  Your local emergency services (911 in the [place]  A suicide crisis helpline, such as the National Suicide Prevention Lifeline at 1-800-273-8255. This is open 24 hours a day. Summary  Adjustment disorder is a group of symptoms that can develop   to your nearest emergency department or call:  · Your local emergency services (911 in the [place]  · A suicide crisis helpline, such as the National Suicide Prevention Lifeline at 1-800-273-8255. This is open 24 hours a day.  Summary  · Adjustment disorder is a group of symptoms that can develop after a stressful life event, such as the loss of a job or serious physical illness. The symptoms can affect how you feel, think, and act. They may interfere with your relationships.  · Symptoms of this condition start within three months  of the stressful event. They do not last more than six months, unless the stressful circumstances last longer.  · Treatment may include talk therapy, medicines, participation in a support group, or observation to see if symptoms improve.  · Contact your health care provider if your symptoms get worse or do not improve in six months.  · If you ever feel like you may hurt yourself or others, or have thoughts about taking your own life, get help right away.  This information is not intended to replace advice given to you by your health care provider. Make sure you discuss any questions you have with your health care provider.  Document Released: 08/01/2006 Document Revised: 01/26/2017 Document Reviewed: 01/26/2017  Elsevier Interactive Patient Education © 2019 [employer]

## 2018-12-06 NOTE — Progress Notes (Signed)
Subjective:    CC: Mood  HPI:  Andrew Frost is recently dealing with separate from his wife and feeling stressed with caring for his children.  He is just really struggled over the last couple months.  He is finally come to the point where he feels like he needs some help and cannot do this on his own.  He said he continues to put on for side every day for his children to be positive is normal he is a very happy person.  But this is just been a real struggle and they are starting to see through the cracks.  They have even written him letters asking him not to be sad.  He complains of little interest or pleasure doing things nearly every day and feeling down depressed and hopeless nearly every day.  He also reports having thoughts of harming himself but says he would not do that as he would put his kids first.  They live with him full-time.  He also reports feeling nervous and on edge trouble relaxing and significant sleep difficulty.  He is tried a couple over-the-counter things including ZzzQuil and melatonin.  The melatonin really was not very helpful but is equal did.  Also noticed that his hall intake has ramped up over the last several weeks as well and he knows that is not good and has already stopped drinking.  He has never taken a mood medication.   He also had elevated liver enzymes on his last set of blood work.  BP 138/88   Pulse 93   Ht 5\' 9"  (1.753 m)   Wt 213 lb (96.6 kg)   SpO2 96%   BMI 31.45 kg/m     Allergies  Allergen Reactions  . Prednisone Itching    Past Medical History:  Diagnosis Date  . Avascular necrosis of bone of left hip ([place]) 06/04/2016  . Avascular necrosis of bones of both hips ([place]) 01/24/2016  . Chest pain 10/28/2018  . Diverticulitis   . Hyperlipidemia 04/26/2015  . Prostate calculus   . Shortness of breath 10/28/2018    Past Surgical History:  Procedure Laterality Date  . LUMBAR SPINE SURGERY  20012   disc issues.   Marland Kitchen TOTAL HIP ARTHROPLASTY Left 06/05/2016    Procedure: TOTAL HIP ARTHROPLASTY ANTERIOR APPROACH;  Surgeon: Frederik Pear, MD;  Location: [facility];  Service: Orthopedics;  Laterality: Left;  Marland Kitchen VASECTOMY  12/2008    Social History   Socioeconomic History  . Marital status: Divorced    Spouse name: Not on file  . Number of children: Not on file  . Years of education: Not on file  . Highest education level: Not on file  Occupational History  . Not on file  Social Needs  . Financial resource strain: Not on file  . Food insecurity:    Worry: Not on file    Inability: Not on file  . Transportation needs:    Medical: Not on file    Non-medical: Not on file  Tobacco Use  . Smoking status: Former Smoker    Packs/day: 0.50    Years: 5.00    Pack years: 2.50    Types: Cigarettes    Last attempt to quit: 2000    Years since quitting: 20.0  . Smokeless tobacco: Former Network engineer and Sexual Activity  . Alcohol use: Yes    Alcohol/week: 1.0 standard drinks    Types: 1 Cans of beer per week    Comment: 1 beer every  few days  . Drug use: Not Currently    Types: Cocaine    Comment: SEVERAL DIFF DRUGS AS TEEN EARLY 20S,  NONE SINCE  . Sexual activity: Not on file  Lifestyle  . Physical activity:    Days per week: Not on file    Minutes per session: Not on file  . Stress: Not on file  Relationships  . Social connections:    Talks on phone: Not on file    Gets together: Not on file    Attends religious service: Not on file    Active member of club or organization: Not on file    Attends meetings of clubs or organizations: Not on file    Relationship status: Not on file  . Intimate partner violence:    Fear of current or ex partner: Not on file    Emotionally abused: Not on file    Physically abused: Not on file    Forced sexual activity: Not on file  Other Topics Concern  . Not on file  Social History Narrative   He is very active.      Family History  Problem Relation Age of Onset  . Alcohol abuse Unknown         parents  . Depression Unknown        parents  . Hypertension Unknown        grandparents  . Hyperlipidemia Unknown        grandparents  . Heart disease Unknown   . Valvular heart disease Maternal Grandmother   . Heart attack Paternal Grandfather     Outpatient Encounter Medications as of 12/06/2018  Medication Sig  . aspirin 81 MG EC tablet TAKE 1 TABLET (81 MG TOTAL) BY MOUTH DAILY.  Marland Kitchen Cyanocobalamin (VITAMIN B12) 1000 MCG TBCR Take 1 tablet by mouth daily.   Marland Kitchen MEGARED OMEGA-3 KRILL OIL 500 MG CAPS Take 1,000 mg by mouth daily.   . Multiple Vitamin (MULTIVITAMIN) tablet Take 1 tablet by mouth daily.  . rosuvastatin (CRESTOR) 10 MG tablet Take 1 tablet (10 mg total) by mouth daily.  Marland Kitchen escitalopram (LEXAPRO) 5 MG tablet Take 1 tablet (5 mg total) by mouth daily.   No facility-administered encounter medications on file as of 12/06/2018.         Objective:    General: Well Developed, well nourished, and in no acute distress.  Neuro: Alert and oriented x3, extra-ocular muscles intact, sensation grossly intact.  HEENT: Normocephalic, atraumatic  Skin: Warm and dry, no rashes. Cardiac: not examined   Respiratory: Not using accessory muscles, speaking in full sentences.   Impression and Recommendations:   Acute adjustment disorder with depressed mood-PHQ 9 score of 2 for and gad 7 score of 21.  We discussed options.  Encouraged him to strongly consider professional therapy/counseling.  He does feel like he has a great support network with friends that he relies on but I did encourage him that professional counseling could really be helpful through this stressful time in his life.  Also discussed medication as a potential treatment option.  He is open to this.  We will start Lexapro 5 mg daily and will see him back in 3 weeks.  We can adjust the dose at that time depending on his response to the medication.  Warned about potential side effects and concerns. Reminded not to mix  medication with alcohol.   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  I want to hold off on any prescription sleep aids and try to give the mood medication time to work to see if this improves his sleep as well.  Encouraged him to really try to work on finding an outlet to help reduce his stress.  Elevated liver enzymes - due to recheck levels today.   Time spent 25 minutes, greater than 50% of the time spent face-to-face counseling about adjustment disorder with depressed mood and insomnia and elevated liver enzymes.

## 2018-12-27 ENCOUNTER — Ambulatory Visit (INDEPENDENT_AMBULATORY_CARE_PROVIDER_SITE_OTHER): Payer: 59 | Admitting: Family Medicine

## 2018-12-27 ENCOUNTER — Encounter: Payer: Self-pay | Admitting: Family Medicine

## 2018-12-27 VITALS — BP 137/76 | HR 77 | Ht 69.0 in | Wt 208.0 lb

## 2018-12-27 DIAGNOSIS — F4321 Adjustment disorder with depressed mood: Secondary | ICD-10-CM

## 2018-12-27 NOTE — Progress Notes (Signed)
Subjective:    CC: New start medication  HPI:  Andrew Frost she is here today to follow-up for acute adjustment disorder.  When I last saw him about 3 weeks ago he was really feeling very overwhelmed and very stressed.  He had recently gone through divorce and is the primary caretaker for his children.  He had also recently met someone that he got into a relationship with but then it  fell apart as well he was very nervous about introducing her to his children.  He says about a week after he saw me he felt like he was really trying to deal with his emotions and analyze the situation and realized that he actually felt quite sad about the loss of the relationship and once he realized that felt like things just got better as far as emotionally.  Did take the Lexapro 5 mg he said he never really noticed any side effects or problems but is not sure if the improvement in his mood is related to the medication or just better realization of his situation and making some peace with it.  [place] he has good family support is not currently enrolled in any therapy or counseling.  Past medical history, Surgical history, Family history not pertinant except as noted below, Social history, Allergies, and medications have been entered into the medical record, reviewed, and corrections made.   Review of Systems: No fevers, chills, night sweats, weight loss, chest pain, or shortness of breath.   Objective:    General: Well Developed, well nourished, and in no acute distress.  Neuro: Alert and oriented x3, extra-ocular muscles intact, sensation grossly intact.  HEENT: Normocephalic, atraumatic  Skin: Warm and dry, no rashes. Cardiac: Regular rate and rhythm, no murmurs rubs or gallops, no lower extremity edema.  Respiratory: Clear to auscultation bilaterally. Not using accessory muscles, speaking in full sentences.   Impression and Recommendations:   Acute adjustment disorder with depressed mood-he is actually doing  significantly better today.  PHQ 9 score of 2 today with previous of 24.  Gad 7 score of 1 today with previous of 21.  It does sound like some improvement in his mood is related to some better personal evaluation assessment of current situation.  Some of it may be related to medication as well just not quite clear at this point.  He did have a very brisk response in a very short period of time.  We discussed options.  For now we will continue with Lexapro for at least the next 6 to 8 weeks.  If that point he still doing really well then can consider tapering off.  If he decides to continue it and I would like to see him back in 4 months.

## 2018-12-27 NOTE — Patient Instructions (Signed)
OK to continue the lexapro for 6-8 more weeks. When ready to taper then take one tab every other day for 6 days and then stop.

## 2018-12-28 ENCOUNTER — Other Ambulatory Visit: Payer: Self-pay | Admitting: Family Medicine

## 2019-02-08 LAB — COMPLETE METABOLIC PANEL WITH GFR
AG Ratio: 2 (calc) (ref 1.0–2.5)
ALBUMIN MSPROF: 4.7 g/dL (ref 3.6–5.1)
ALKALINE PHOSPHATASE (APISO): 42 U/L (ref 36–130)
ALT: 32 U/L (ref 9–46)
AST: 17 U/L (ref 10–40)
BUN: 18 mg/dL (ref 7–25)
CO2: 29 mmol/L (ref 20–32)
CREATININE: 0.98 mg/dL (ref 0.60–1.35)
Calcium: 9.8 mg/dL (ref 8.6–10.3)
Chloride: 103 mmol/L (ref 98–110)
GFR, Est African American: 108 mL/min/{1.73_m2} (ref >=60)
GFR, Est Non African American: 93 mL/min/{1.73_m2} (ref >=60)
GLOBULIN: 2.3 g/dL (ref 1.9–3.7)
Glucose, Bld: 91 mg/dL (ref 65–99)
Potassium: 4.6 mmol/L (ref 3.5–5.3)
SODIUM: 138 mmol/L (ref 135–146)
Total Bilirubin: 0.9 mg/dL (ref 0.2–1.2)
Total Protein: 7 g/dL (ref 6.1–8.1)

## 2019-05-11 ENCOUNTER — Other Ambulatory Visit: Payer: Self-pay | Admitting: Cardiology

## 2019-06-28 ENCOUNTER — Other Ambulatory Visit: Payer: Self-pay | Admitting: Family Medicine

## 2019-07-28 ENCOUNTER — Other Ambulatory Visit: Payer: Self-pay | Admitting: Family Medicine

## 2019-09-01 ENCOUNTER — Other Ambulatory Visit: Payer: Self-pay | Admitting: Family Medicine

## 2019-10-07 ENCOUNTER — Other Ambulatory Visit: Payer: Self-pay | Admitting: Family Medicine

## 2019-11-30 ENCOUNTER — Other Ambulatory Visit: Payer: Self-pay | Admitting: Cardiology

## 2019-12-17 ENCOUNTER — Telehealth: Payer: Self-pay

## 2019-12-17 NOTE — Telephone Encounter (Signed)
Andrew Frost called and states he tested positive for COVID-19 on 12/13/2019. His symptoms to start was body aches, headache, fatigue, no taste or smell and runny nose. He reports he still has runny nose, headache, no taste or smell. Denies fever. He wanted to know when he can return to work. Please advise.

## 2019-12-17 NOTE — Telephone Encounter (Signed)
Patient was advised and did not have any questions.  

## 2019-12-17 NOTE — Telephone Encounter (Signed)
Per current guidelines okay to return back to work 10 days after testing positive as long as he is feeling some better and he has been afebrile for 48 hours.

## 2019-12-26 ENCOUNTER — Other Ambulatory Visit: Payer: Self-pay | Admitting: Cardiology

## 2020-01-18 ENCOUNTER — Other Ambulatory Visit: Payer: Self-pay | Admitting: Cardiology

## 2020-02-01 ENCOUNTER — Other Ambulatory Visit: Payer: Self-pay | Admitting: Cardiology

## 2020-02-17 ENCOUNTER — Telehealth: Payer: Self-pay

## 2020-02-17 DIAGNOSIS — Z0000 Encounter for general adult medical examination without abnormal findings: Secondary | ICD-10-CM

## 2020-02-17 DIAGNOSIS — E782 Mixed hyperlipidemia: Secondary | ICD-10-CM

## 2020-02-17 DIAGNOSIS — E785 Hyperlipidemia, unspecified: Secondary | ICD-10-CM

## 2020-02-17 NOTE — Telephone Encounter (Signed)
Perfect. Thank you!

## 2020-02-17 NOTE — Telephone Encounter (Signed)
CMP, CBC and Lipid ordered for CPE.

## 2020-02-20 ENCOUNTER — Ambulatory Visit (INDEPENDENT_AMBULATORY_CARE_PROVIDER_SITE_OTHER): Payer: 59 | Admitting: Family Medicine

## 2020-02-20 ENCOUNTER — Encounter: Payer: Self-pay | Admitting: Family Medicine

## 2020-02-20 ENCOUNTER — Other Ambulatory Visit: Payer: Self-pay

## 2020-02-20 VITALS — BP 115/69 | HR 79 | Ht 69.0 in | Wt 205.0 lb

## 2020-02-20 DIAGNOSIS — Z0000 Encounter for general adult medical examination without abnormal findings: Secondary | ICD-10-CM

## 2020-02-20 LAB — POCT GLYCOSYLATED HEMOGLOBIN (HGB A1C): Hemoglobin A1C: 5.7 % — AB (ref 4.0–5.6)

## 2020-02-20 NOTE — Patient Instructions (Signed)

## 2020-02-20 NOTE — Progress Notes (Signed)
CPE  Established Patient Office Visit  Subjective:  Patient ID: Andrew Frost, male    DOB: 08-27-1976  Age: 44 y.o. MRN: [id number]  CC:  Chief Complaint  Patient presents with  . Annual Exam    HPI Andrew Frost presents for CPE. Had labs done earlier this week.  He really does not exercise routinely but does try to eat healthy.  He tries to stay away from bleach white flour products and does try to limit his intake of sweets and unhealthy foods. He was able to review his labs online and did want to discuss the elevated glucose.  His cholesterol actually looks much better than prior.  Past Medical History:  Diagnosis Date  . Avascular necrosis of bone of left hip ([place]) 06/04/2016  . Avascular necrosis of bones of both hips ([place]) 01/24/2016  . Chest pain 10/28/2018  . Diverticulitis   . Hyperlipidemia 04/26/2015  . Prostate calculus   . Shortness of breath 10/28/2018    Past Surgical History:  Procedure Laterality Date  . LUMBAR SPINE SURGERY  20012   disc issues.   Marland Kitchen TOTAL HIP ARTHROPLASTY Left 06/05/2016   Procedure: TOTAL HIP ARTHROPLASTY ANTERIOR APPROACH;  Surgeon: Frederik Pear, MD;  Location: [facility];  Service: Orthopedics;  Laterality: Left;  Marland Kitchen VASECTOMY  12/2008    Family History  Problem Relation Age of Onset  . Alcohol abuse Unknown        parents  . Depression Unknown        parents  . Hypertension Unknown        grandparents  . Hyperlipidemia Unknown        grandparents  . Heart disease Unknown   . Valvular heart disease Maternal Grandmother   . Heart attack Paternal Grandfather     Social History   Socioeconomic History  . Marital status: Divorced    Spouse name: Not on file  . Number of children: Not on file  . Years of education: Not on file  . Highest education level: Not on file  Occupational History  . Not on file  Tobacco Use  . Smoking status: Former Smoker    Packs/day: 0.50    Years: 5.00    Pack years: 2.50    Types: Cigarettes   Quit date: 2000    Years since quitting: 21.2  . Smokeless tobacco: Former Network engineer and Sexual Activity  . Alcohol use: Yes    Alcohol/week: 1.0 standard drinks    Types: 1 Cans of beer per week    Comment: 1 beer every few days  . Drug use: Not Currently    Types: Cocaine    Comment: SEVERAL DIFF DRUGS AS TEEN EARLY 20S,  NONE SINCE  . Sexual activity: Not on file  Other Topics Concern  . Not on file  Social History Narrative   He is very active.     Social Determinants of Health   Financial Resource Strain:   . Difficulty of Paying Living Expenses:   Food Insecurity:   . Worried About Charity fundraiser in the Last Year:   . Arboriculturist in the Last Year:   Transportation Needs:   . Film/video editor (Medical):   Marland Kitchen Lack of Transportation (Non-Medical):   Physical Activity:   . Days of Exercise per Week:   . Minutes of Exercise per Session:   Stress:   . Feeling of Stress :   Social Connections:   .  Frequency of Communication with Friends and Family:   . Frequency of Social Gatherings with Friends and Family:   . Attends Religious Services:   . Active Member of Clubs or Organizations:   . Attends Archivist Meetings:   Marland Kitchen Marital Status:   Intimate Partner Violence:   . Fear of Current or Ex-Partner:   . Emotionally Abused:   Marland Kitchen Physically Abused:   . Sexually Abused:     Outpatient Medications Prior to Visit  Medication Sig Dispense Refill  . BLACK ELDERBERRY PO Take 1 each by mouth daily.    . cholecalciferol (VITAMIN D3) 25 MCG (1000 UNIT) tablet Take 1,000 Units by mouth daily.    Marland Kitchen aspirin 81 MG EC tablet TAKE 1 TABLET (81 MG TOTAL) BY MOUTH DAILY. 90 tablet 3  . Cyanocobalamin (VITAMIN B12) 1000 MCG TBCR Take 1 tablet by mouth daily.     Marland Kitchen MEGARED OMEGA-3 KRILL OIL 500 MG CAPS Take 1,000 mg by mouth daily.     . Multiple Vitamin (MULTIVITAMIN) tablet Take 1 tablet by mouth daily.    . Probiotic Product (PROBIOTIC PO) Take 1 tablet  by mouth daily.    . rosuvastatin (CRESTOR) 10 MG tablet TAKE 1 TABLET BY MOUTH EVERY DAY 15 tablet 0  . escitalopram (LEXAPRO) 5 MG tablet Take 1 tablet (5 mg total) by mouth daily. Due for follow up visit w/PCP 60 tablet 0   No facility-administered medications prior to visit.    Allergies  Allergen Reactions  . Prednisone Itching    ROS Review of Systems    Objective:    Physical Exam  Constitutional: He is oriented to person, place, and time. He appears well-developed and well-nourished.  HENT:  Head: Normocephalic and atraumatic.  Right Ear: External ear normal.  Left Ear: External ear normal.  Nose: Nose normal.  Mouth/Throat: Oropharynx is clear and moist.  Eyes: Pupils are equal, round, and reactive to light. Conjunctivae and EOM are normal.  Neck: No thyromegaly present.  Cardiovascular: Normal rate, regular rhythm, normal heart sounds and intact distal pulses.  Pulmonary/Chest: Effort normal and breath sounds normal.  Abdominal: Soft. Bowel sounds are normal. He exhibits no distension and no mass. There is no abdominal tenderness. There is no rebound and no guarding.  Musculoskeletal:        General: Normal range of motion.     Cervical back: Normal range of motion and neck supple.  Lymphadenopathy:    He has no cervical adenopathy.  Neurological: He is alert and oriented to person, place, and time. He has normal reflexes.  Skin: Skin is warm and dry.  Psychiatric: He has a normal mood and affect. His behavior is normal. Judgment and thought content normal.    BP 115/69   Pulse 79   Ht 5\' 9"  (1.753 m)   Wt 205 lb (93 kg)   SpO2 99%   BMI 30.27 kg/m  Wt Readings from Last 3 Encounters:  02/20/20 205 lb (93 kg)  12/27/18 208 lb (94.3 kg)  12/06/18 213 lb (96.6 kg)     There are no preventive care reminders to display for this patient.  There are no preventive care reminders to display for this patient.  Lab Results  Component Value Date   TSH 1.46  10/09/2018   Lab Results  Component Value Date   WBC 5.0 02/19/2020   HGB 16.1 02/19/2020   HCT 48.1 02/19/2020   MCV 87.1 02/19/2020   PLT 199 02/19/2020  Lab Results  Component Value Date   NA 140 02/19/2020   K 4.9 02/19/2020   CO2 28 02/19/2020   GLUCOSE 112 (H) 02/19/2020   BUN 19 02/19/2020   CREATININE 1.25 02/19/2020   BILITOT 1.0 02/19/2020   ALKPHOS 48 01/21/2016   AST 25 02/19/2020   ALT 43 02/19/2020   PROT 7.3 02/19/2020   ALBUMIN 4.4 01/21/2016   CALCIUM 10.0 02/19/2020   ANIONGAP 7 06/06/2016   Lab Results  Component Value Date   CHOL 160 02/19/2020   Lab Results  Component Value Date   HDL 47 02/19/2020   Lab Results  Component Value Date   LDLCALC 93 02/19/2020   Lab Results  Component Value Date   TRIG 100 02/19/2020   Lab Results  Component Value Date   CHOLHDL 3.4 02/19/2020   Lab Results  Component Value Date   HGBA1C 5.7 (A) 02/20/2020      Assessment & Plan:   Problem List Items Addressed This Visit    None    Visit Diagnoses    Wellness examination    -  Primary   Relevant Orders   POCT glycosylated hemoglobin (Hb A1C) (Completed)     Keep up a regular exercise program and make sure you are eating a healthy diet Try to eat 4 servings of dairy a day, or if you are lactose intolerant take a calcium with vitamin D daily.  Your vaccines are up to date.   Elevated glucose on recent fasting labs.  We will do hemoglobin A1c today.  Globin A1c 5.7 today which is just borderline for prediabetes.  He says he is already planning on cutting out some sweets and chocolates that he been eating some recently.  So encouraged him to start exercising more regularly.  Hyperlipidemia-cholesterol this time look great compared to previous he is definitely made some great dietary changes just encouraged him to try to really ramp up his exercise.   No orders of the defined types were placed in this encounter.   Follow-up: Return in about 1  year (around 02/19/2021) for Wellness Exam.    Beatrice Lecher, MD

## 2020-02-23 LAB — LIPID PANEL W/REFLEX DIRECT LDL
Cholesterol: 160 mg/dL (ref <200)
HDL: 47 mg/dL (ref >=40)
LDL Cholesterol (Calc): 93 mg/dL (calc)
Non-HDL Cholesterol (Calc): 113 mg/dL (calc) (ref <130)
Total CHOL/HDL Ratio: 3.4 (calc) (ref <5.0)
Triglycerides: 100 mg/dL (ref <150)

## 2020-02-23 LAB — TEST AUTHORIZATION

## 2020-02-23 LAB — COMPLETE METABOLIC PANEL WITH GFR
AG Ratio: 1.9 (calc) (ref 1.0–2.5)
ALT: 43 U/L (ref 9–46)
AST: 25 U/L (ref 10–40)
Albumin: 4.8 g/dL (ref 3.6–5.1)
Alkaline phosphatase (APISO): 51 U/L (ref 36–130)
BUN: 19 mg/dL (ref 7–25)
CO2: 28 mmol/L (ref 20–32)
Calcium: 10 mg/dL (ref 8.6–10.3)
Chloride: 103 mmol/L (ref 98–110)
Creat: 1.25 mg/dL (ref 0.60–1.35)
GFR, Est African American: 80 mL/min/{1.73_m2} (ref >=60)
GFR, Est Non African American: 69 mL/min/{1.73_m2} (ref >=60)
Globulin: 2.5 g/dL (calc) (ref 1.9–3.7)
Glucose, Bld: 112 mg/dL — ABNORMAL HIGH (ref 65–99)
Potassium: 4.9 mmol/L (ref 3.5–5.3)
Sodium: 140 mmol/L (ref 135–146)
Total Bilirubin: 1 mg/dL (ref 0.2–1.2)
Total Protein: 7.3 g/dL (ref 6.1–8.1)

## 2020-02-23 LAB — CBC WITH DIFFERENTIAL/PLATELET
Absolute Monocytes: 500 cells/uL (ref 200–950)
Basophils Absolute: 20 cells/uL (ref 0–200)
Basophils Relative: 0.4 %
Eosinophils Absolute: 80 cells/uL (ref 15–500)
Eosinophils Relative: 1.6 %
HCT: 48.1 % (ref 38.5–50.0)
Hemoglobin: 16.1 g/dL (ref 13.2–17.1)
Lymphs Abs: 1785 cells/uL (ref 850–3900)
MCH: 29.2 pg (ref 27.0–33.0)
MCHC: 33.5 g/dL (ref 32.0–36.0)
MCV: 87.1 fL (ref 80.0–100.0)
MPV: 10.9 fL (ref 7.5–12.5)
Monocytes Relative: 10 %
Neutro Abs: 2615 cells/uL (ref 1500–7800)
Neutrophils Relative %: 52.3 %
Platelets: 199 10*3/uL (ref 140–400)
RBC: 5.52 10*6/uL (ref 4.20–5.80)
RDW: 13.8 % (ref 11.0–15.0)
Total Lymphocyte: 35.7 %
WBC: 5 10*3/uL (ref 3.8–10.8)

## 2020-02-23 LAB — SAR COV2 SEROLOGY (COVID19)AB(IGG),IA: SARS CoV2 AB IGG: POSITIVE — AB

## 2020-02-24 ENCOUNTER — Encounter: Payer: Self-pay | Admitting: Family Medicine

## 2020-02-24 MED ORDER — ROSUVASTATIN CALCIUM 10 MG PO TABS: 10.0000 mg | ORAL_TABLET | Freq: Every day | ORAL | 3 refills | Status: DC

## 2020-02-24 NOTE — Telephone Encounter (Signed)
1 yr supply sent. Tell him thank you!

## 2020-11-19 ENCOUNTER — Encounter: Payer: Self-pay | Admitting: Nurse Practitioner

## 2020-11-19 ENCOUNTER — Other Ambulatory Visit: Payer: Self-pay

## 2020-11-19 ENCOUNTER — Ambulatory Visit: Payer: 59 | Admitting: Nurse Practitioner

## 2020-11-19 VITALS — BP 126/82 | HR 64 | Temp 98.4°F | Ht 69.0 in | Wt 209.8 lb

## 2020-11-19 DIAGNOSIS — M7522 Bicipital tendinitis, left shoulder: Secondary | ICD-10-CM | POA: Diagnosis not present

## 2020-11-19 MED ORDER — MELOXICAM 15 MG PO TABS: 15.0000 mg | ORAL_TABLET | Freq: Every day | ORAL | 0 refills | Status: DC

## 2020-11-19 NOTE — Progress Notes (Signed)
Acute Office Visit  Subjective:    Patient ID: Andrew Frost, male    DOB: 02-Apr-1976, 44 y.o.   MRN: [id number]  Chief Complaint  Patient presents with  . Elbow Pain         HPI Andrew Frost is a 44 year old male who presents today with three weeks of left anterior elbow pain.    He endorses pain with pronation and supination of the hand, extension of the left arm at the elbow joint, holding objects in the left hand, and weakness with counter pressure against downward motion on the forearm. He endorses that the pain is completely absent with the elbow flexed at a 90 degree angle and forearm resting proximally to his chest.   He denies redness, swelling, "popping" sound at time of pain, fever, chills, loss of function of hand or wrist, loss of ability to lift, shoulder pain, or nerve pain.   He reports that the day before the pain started he was golfing (he is left handed) and did change his grip. He denies any pain while golfing, but reports he woke the next day with pain. He has been using ibuprofen and tylenol to help with the pain. He reports 0/10 pain at rest and 10/10 pain when he applies pressure to the elbow joint with lifting. He reports the pain is the same today as the first day it was noted- no improvement, but not worse.   Past Medical History:  Diagnosis Date  . Avascular necrosis of bone of left hip ([place]) 06/04/2016  . Avascular necrosis of bones of both hips ([place]) 01/24/2016  . Chest pain 10/28/2018  . Diverticulitis   . Hyperlipidemia 04/26/2015  . Prostate calculus   . Shortness of breath 10/28/2018    Past Surgical History:  Procedure Laterality Date  . LUMBAR SPINE SURGERY  20012   disc issues.   Marland Kitchen TOTAL HIP ARTHROPLASTY Left 06/05/2016   Procedure: TOTAL HIP ARTHROPLASTY ANTERIOR APPROACH;  Surgeon: Frederik Pear, MD;  Location: [facility];  Service: Orthopedics;  Laterality: Left;  Marland Kitchen VASECTOMY  12/2008    Family History  Problem Relation Age of Onset  . Alcohol  abuse Unknown        parents  . Depression Unknown        parents  . Hypertension Unknown        grandparents  . Hyperlipidemia Unknown        grandparents  . Heart disease Unknown   . Valvular heart disease Maternal Grandmother   . Heart attack Paternal Grandfather     Social History   Socioeconomic History  . Marital status: Divorced    Spouse name: Not on file  . Number of children: Not on file  . Years of education: Not on file  . Highest education level: Not on file  Occupational History  . Not on file  Tobacco Use  . Smoking status: Former Smoker    Packs/day: 0.50    Years: 5.00    Pack years: 2.50    Types: Cigarettes    Quit date: 2000    Years since quitting: 21.9  . Smokeless tobacco: Former Network engineer  . Vaping Use: Former  Substance and Sexual Activity  . Alcohol use: Yes    Alcohol/week: 1.0 standard drink    Types: 1 Cans of beer per week    Comment: 1 beer every few days  . Drug use: Not Currently    Types: Cocaine  Comment: SEVERAL DIFF DRUGS AS TEEN [facility]ust[facility] 20S,  NONE SINCE  . Sexual activity: Not on file  Other Topics Concern  . Not on file  Social History Narrative   He is very active.     Social Determinants of Health   Financial Resource Strain: Not on file  Food Insecurity: Not on file  Transportation Needs: Not on file  Physical Activity: Not on file  Stress: Not on file  Social Connections: Not on file  Intimate Partner Violence: Not on file    Outpatient Medications Prior to Visit  Medication Sig Dispense Refill  . aspirin 81 MG EC tablet TAKE 1 TABLET (81 MG TOTAL) BY MOUTH DAILY. 90 tablet 3  . BLACK ELDERBERRY PO Take 1 each by mouth daily.    . cholecalciferol (VITAMIN D3) 25 MCG (1000 UNIT) tablet Take 1,000 Units by mouth daily.    . Cyanocobalamin (VITAMIN B12) 1000 MCG TBCR Take 1 tablet by mouth daily.     Marland Kitchen MEGARED OMEGA-3 KRILL OIL 500 MG CAPS Take 1,000 mg by mouth daily.     . Multiple Vitamin  (MULTIVITAMIN) tablet Take 1 tablet by mouth daily.    . Probiotic Product (PROBIOTIC PO) Take 1 tablet by mouth daily.    . rosuvastatin (CRESTOR) 10 MG tablet Take 1 tablet (10 mg total) by mouth daily. 90 tablet 3   No facility-administered medications prior to visit.    Allergies  Allergen Reactions  . Prednisone Itching    Review of Systems All review of systems negative except what is listed in the HPI     Objective:    Physical Exam Vitals and nursing note reviewed.  Constitutional:      Appearance: Normal appearance. He is normal weight.  HENT:     Head: Normocephalic.  Eyes:     Extraocular Movements: Extraocular movements intact.     Conjunctiva/sclera: Conjunctivae normal.     Pupils: Pupils are equal, round, and reactive to light.  Cardiovascular:     Pulses: Normal pulses.  Musculoskeletal:        General: Tenderness and signs of injury present. No swelling.     Right shoulder: Normal. No tenderness or bony tenderness. Normal range of motion. Normal strength.     Left shoulder: Normal. No tenderness or bony tenderness. Normal range of motion. Normal strength.     Right elbow: Normal.     Left elbow: No swelling or deformity. Normal range of motion. Tenderness present.     Right forearm: Normal.     Left forearm: No swelling, deformity or tenderness.     Right wrist: Normal. Normal pulse.     Left wrist: No swelling, deformity or tenderness. Normal range of motion. Normal pulse.     Right hand: Normal.     Left hand: No swelling, deformity, tenderness or bony tenderness. Normal range of motion. Normal strength. Normal sensation. Normal capillary refill. Normal pulse.       Arms:  Skin:    General: Skin is warm and dry.     Capillary Refill: Capillary refill takes less than 2 seconds.     Findings: No bruising, erythema or lesion.  Neurological:     General: No focal deficit present.     Mental Status: He is alert and oriented to person, place, and time.   Psychiatric:        Mood and Affect: Mood normal.        Behavior: Behavior normal.  Thought Content: Thought content normal.        Judgment: Judgment normal.     BP 126/82   Pulse 64   Temp 98.4 F (36.9 C)   Ht 5\' 9"  (1.753 m)   Wt 209 lb 12.8 oz (95.2 kg)   SpO2 98%   BMI 30.98 kg/m  Wt Readings from Last 3 Encounters:  11/19/20 209 lb 12.8 oz (95.2 kg)  02/20/20 205 lb (93 kg)  12/27/18 208 lb (94.3 kg)    There are no preventive care reminders to display for this patient.  There are no preventive care reminders to display for this patient.   Lab Results  Component Value Date   TSH 1.46 10/09/2018   Lab Results  Component Value Date   WBC 5.0 02/19/2020   HGB 16.1 02/19/2020   HCT 48.1 02/19/2020   MCV 87.1 02/19/2020   PLT 199 02/19/2020   Lab Results  Component Value Date   NA 140 02/19/2020   K 4.9 02/19/2020   CO2 28 02/19/2020   GLUCOSE 112 (H) 02/19/2020   BUN 19 02/19/2020   CREATININE 1.25 02/19/2020   BILITOT 1.0 02/19/2020   ALKPHOS 48 01/21/2016   AST 25 02/19/2020   ALT 43 02/19/2020   PROT 7.3 02/19/2020   ALBUMIN 4.4 01/21/2016   CALCIUM 10.0 02/19/2020   ANIONGAP 7 06/06/2016   Lab Results  Component Value Date   CHOL 160 02/19/2020   Lab Results  Component Value Date   HDL 47 02/19/2020   Lab Results  Component Value Date   LDLCALC 93 02/19/2020   Lab Results  Component Value Date   TRIG 100 02/19/2020   Lab Results  Component Value Date   CHOLHDL 3.4 02/19/2020   Lab Results  Component Value Date   HGBA1C 5.7 (A) 02/20/2020       Assessment & Plan:   1. Biceps tendonitis on left Symptoms and presentation consistent with distal bicep tendonitis/tendonopathy. Etiology of the injury unclear- possibly related to repetitive motion with golfing the day prior to the onset of pain.  Hook test performed with no signs of tendon rupture. There is no significant edema or noted ecchymosis to the joint.  ROM is  WNL, but pain is apparent with extension of the elbow. Pt reports increased pain, felt up to the shoulder with applied pressure to the distal biceps tendon. There is weakness present with counter pressure to the left forearm compared to the right.  Plan to utilize ice and heat to the affected area several times a day and start meloxicam daily for pain and inflammation. Recommend resting the joint for the next several days and three times a day begin exercises for the biceps tendon- handout provided.  Discussed that a compression strap or wrap may be helpful to provide counter support to the joint.  If no improvement of symptoms in 3 weeks, recommend follow-up with Dr. Darene Lamer for further evaluation and possible injection.  He is not a candidate for prednisone burst due to noted allergy.   - meloxicam (MOBIC) 15 MG tablet; Take 1 tablet (15 mg total) by mouth daily.  Dispense: 30 tablet; Refill: 0    Orma Render, NP

## 2020-11-19 NOTE — Patient Instructions (Signed)
Distal Biceps Tendinitis  Distal biceps tendinitis is inflammation of the distal biceps tendon. This tendon is a strong cord of tissue that connects the biceps muscle on the front of the upper arm to the radius bone in the elbow. Distal biceps tendinitis can interfere with the ability to bend the elbow and to turn the hand palm up (supination). Distal biceps tendinitis may include a grade 1 or grade 2 strain of the tendon.  A grade 1 strain is mild. There is a slight pull of the tendon without any stretching or tearing of the tendon. There is also usually no loss of biceps muscle strength.  A grade 2 strain is moderate. There is a small tear in the tendon. The tendon is stretched, and biceps muscle strength is usually decreased. This condition is most often caused by overusing the elbow joint and the biceps muscle. It usually heals within 6 weeks. What are the causes? This condition may be caused by:  A sudden increase in the frequency or intensity of activity that involves the elbow and the biceps muscle.  Overuse of the biceps muscle. This can happen when you do the same movements over and over, such as: ? Turning your hand palm up. ? Forceful straightening (hyperextension) of the elbow. ? Bending of the elbow.  A direct, forceful hit or injury (trauma) to the elbow. This is rare. What increases the risk? The following factors may make you more likely to develop this condition:  Playing contact sports.  Playing sports that involve throwing and overhead movements, including racket sports, gymnastics, weight lifting, or bodybuilding.  Doing physical labor.  Having poor strength and flexibility of the arm and shoulder.  Having injured other parts of the elbow. What are the signs or symptoms? Symptoms of this condition may include:  Pain and inflammation in the front of the elbow. Pain may get worse during certain movements, such as: ? Turning your hand palm up. ? Bending your  elbow. ? Lifting or carrying objects. ? Throwing.  A feeling of warmth in the front of your elbow.  A feeling of weakness.  A crackling sound (crepitation) when you move or touch the elbow or the upper arm. In some cases, symptoms may return (recur) after treatment, and they may be long-lasting (chronic). How is this diagnosed? This condition is diagnosed based on:  Your symptoms.  Your medical history.  A physical exam. Your health care provider may have you do arm movements to test your range of motion.  You may have other tests, including: ? X-rays. ? MRI. How is this treated? Treatment for this condition depends on the severity of the injury. You may be treated by:  Resting the injured arm. Avoid flexing the elbow.  Applying ice or heat to the injured area.  Taking medicines to relieve pain and inflammation.  Ultrasound therapy. This applies sound waves to the injured area.  Physical therapy. Follow these instructions at home: Managing pain, stiffness, and swelling      If directed, put ice on the injured area. ? Put ice in a plastic bag. ? Place a towel between your skin and the bag. ? Leave the ice on for 20 minutes, 2-3 times a day.  If directed, apply heat to the affected area before you exercise. Use the heat source that your health care provider recommends, such as a moist heat pack or a heating pad. ? Place a towel between your skin and the heat source. ? Leave the heat   on for 20-30 minutes. ? Remove the heat if your skin turns bright red. This is especially important if you are unable to feel pain, heat, or cold. You may have a greater risk of getting burned.  Move your fingers often to reduce stiffness and swelling.  Raise (elevate) the injured area above the level of your heart while you are sitting or lying down. Activity  Do not lift anything that is heavier than 10 lb (4.5 kg), or the limit that you are told, until your health care provider says  that it is safe.  Avoid activities that cause pain or make your condition worse.  Return to your normal activities as told by your health care provider. Ask your health care provider what activities are safe for you.  Do exercises as told by your health care provider. General instructions  Take over-the-counter and prescription medicines only as told by your health care provider.  Do not use any products that contain nicotine or tobacco, such as cigarettes, e-cigarettes, and chewing tobacco. These can delay healing. If you need help quitting, ask your health care provider.  Keep all follow-up visits as told by your health care provider. This is important. How is this prevented?  Warm up and stretch before being active.  Cool down and stretch after being active.  Give your body time to rest between periods of activity.  Make sure to use equipment that fits you.  Be safe and responsible while being active. This will help you avoid falls.  Maintain physical fitness, including: ? Strength. ? Flexibility. ? Heart health (cardiovascular fitness). ? The ability to use muscles for a long time (endurance). Contact a health care provider if:  You have symptoms that get worse or do not get better after 2 weeks of treatment.  You develop new symptoms. Get help right away if:  You develop severe pain. Summary  Distal biceps tendinitis is inflammation of the distal biceps tendon.  This injury is caused by a sudden increase in the frequency or intensity of activity or overuse of the biceps muscle.  Distal biceps tendinitis can interfere with the ability to bend the elbow and to turn the hand palm up.  This condition is treated with rest, ice, heat, physical therapy, and pain medicines if needed.  Follow activity and lifting restrictions as told by your health care provider. This information is not intended to replace advice given to you by your health care provider. Make sure you  discuss any questions you have with your health care provider. Document Revised: 03/25/2019 Document Reviewed: 11/26/2018 Elsevier Patient Education  2020 [employer]  

## 2020-12-16 ENCOUNTER — Other Ambulatory Visit: Payer: Self-pay | Admitting: Nurse Practitioner

## 2020-12-16 DIAGNOSIS — M7522 Bicipital tendinitis, left shoulder: Secondary | ICD-10-CM

## 2021-02-13 ENCOUNTER — Other Ambulatory Visit: Payer: Self-pay | Admitting: Family Medicine

## 2021-02-14 NOTE — Telephone Encounter (Signed)
Please call pt and have him schedule a f/u he is due for FASTING LABS.

## 2021-02-14 NOTE — Telephone Encounter (Signed)
Patient scheduled Annual Physical on 02/24/21 and was requesting to have Lab Orders put in prior to his visit. AM

## 2021-02-23 ENCOUNTER — Other Ambulatory Visit: Payer: Self-pay

## 2021-02-23 DIAGNOSIS — E785 Hyperlipidemia, unspecified: Secondary | ICD-10-CM

## 2021-02-23 DIAGNOSIS — Z0000 Encounter for general adult medical examination without abnormal findings: Secondary | ICD-10-CM

## 2021-02-24 ENCOUNTER — Encounter: Payer: Self-pay | Admitting: Family Medicine

## 2021-02-24 ENCOUNTER — Ambulatory Visit: Payer: 59 | Admitting: Family Medicine

## 2021-02-24 ENCOUNTER — Other Ambulatory Visit: Payer: Self-pay

## 2021-02-24 VITALS — BP 125/72 | HR 69 | Ht 69.0 in | Wt 204.0 lb

## 2021-02-24 DIAGNOSIS — Z0000 Encounter for general adult medical examination without abnormal findings: Secondary | ICD-10-CM | POA: Diagnosis not present

## 2021-02-24 DIAGNOSIS — Z1211 Encounter for screening for malignant neoplasm of colon: Secondary | ICD-10-CM

## 2021-02-24 MED ORDER — ROSUVASTATIN CALCIUM 10 MG PO TABS: 10.0000 mg | ORAL_TABLET | Freq: Every day | ORAL | 3 refills | Status: DC

## 2021-02-24 NOTE — Patient Instructions (Signed)

## 2021-02-24 NOTE — Progress Notes (Signed)
Established Patient Office Visit  Subjective:  Patient ID: Andrew Frost, male    DOB: 1976/03/02  Age: 45 y.o. MRN: [id number]  CC:  Chief Complaint  Patient presents with  . Annual Exam    Pt had his labs done this morning prior to OV    HPI Andrew Frost presents for CPE  Past Medical History:  Diagnosis Date  . Avascular necrosis of bone of left hip ([place]) 06/04/2016  . Avascular necrosis of bones of both hips ([place]) 01/24/2016  . Chest pain 10/28/2018  . Diverticulitis   . Hyperlipidemia 04/26/2015  . Prostate calculus   . Shortness of breath 10/28/2018    Past Surgical History:  Procedure Laterality Date  . LUMBAR SPINE SURGERY  20012   disc issues.   Marland Kitchen TOTAL HIP ARTHROPLASTY Left 06/05/2016   Procedure: TOTAL HIP ARTHROPLASTY ANTERIOR APPROACH;  Surgeon: Frederik Pear, MD;  Location: [facility];  Service: Orthopedics;  Laterality: Left;  Marland Kitchen VASECTOMY  12/2008    Family History  Problem Relation Age of Onset  . Alcohol abuse Unknown        parents  . Depression Unknown        parents  . Hypertension Unknown        grandparents  . Hyperlipidemia Unknown        grandparents  . Heart disease Unknown   . Valvular heart disease Maternal Grandmother   . Heart attack Paternal Grandfather     Social History   Socioeconomic History  . Marital status: Single    Spouse name: Not on file  . Number of children: Not on file  . Years of education: Not on file  . Highest education level: Not on file  Occupational History  . Not on file  Tobacco Use  . Smoking status: Former Smoker    Packs/day: 0.50    Years: 5.00    Pack years: 2.50    Types: Cigarettes    Quit date: 2000    Years since quitting: 22.2  . Smokeless tobacco: Former Network engineer  . Vaping Use: Former  Substance and Sexual Activity  . Alcohol use: Yes    Alcohol/week: 1.0 standard drink    Types: 1 Cans of beer per week    Comment: 1 beer every few days  . Drug use: Not Currently    Types:  Cocaine    Comment: SEVERAL DIFF DRUGS AS TEEN EARLY 20S,  NONE SINCE  . Sexual activity: Not on file  Other Topics Concern  . Not on file  Social History Narrative   He is very active.     Social Determinants of Health   Financial Resource Strain: Not on file  Food Insecurity: Not on file  Transportation Needs: Not on file  Physical Activity: Not on file  Stress: Not on file  Social Connections: Not on file  Intimate Partner Violence: Not on file    Outpatient Medications Prior to Visit  Medication Sig Dispense Refill  . aspirin 81 MG EC tablet TAKE 1 TABLET (81 MG TOTAL) BY MOUTH DAILY. 90 tablet 3  . BLACK ELDERBERRY PO Take 1 each by mouth daily.    . Calcium Polycarbophil (FIBER-CAPS PO) Take by mouth.    . cholecalciferol (VITAMIN D3) 25 MCG (1000 UNIT) tablet Take 1,000 Units by mouth daily.    . Cyanocobalamin (VITAMIN B12) 1000 MCG TBCR Take 1 tablet by mouth daily.     Marland Kitchen MEGARED OMEGA-3 KRILL OIL  500 MG CAPS Take 1,000 mg by mouth daily.     . Multiple Vitamin (MULTIVITAMIN) tablet Take 1 tablet by mouth daily.    . Probiotic Product (PROBIOTIC PO) Take 1 tablet by mouth daily.    . rosuvastatin (CRESTOR) 10 MG tablet TAKE 1 TABLET BY MOUTH EVERY DAY 90 tablet 0  . meloxicam (MOBIC) 15 MG tablet TAKE 1 TABLET (15 MG TOTAL) BY MOUTH DAILY. 30 tablet 0   No facility-administered medications prior to visit.    Allergies  Allergen Reactions  . Prednisone Itching    ROS Review of Systems    Objective:    Physical Exam Constitutional:      Appearance: He is well-developed.  HENT:     Head: Normocephalic and atraumatic.     Right Ear: External ear normal.     Left Ear: External ear normal.     Nose: Nose normal.  Eyes:     Conjunctiva/sclera: Conjunctivae normal.     Pupils: Pupils are equal, round, and reactive to light.  Neck:     Thyroid: No thyromegaly.  Cardiovascular:     Rate and Rhythm: Normal rate and regular rhythm.     Heart sounds: Normal  heart sounds.  Pulmonary:     Effort: Pulmonary effort is normal.     Breath sounds: Normal breath sounds.  Abdominal:     General: Bowel sounds are normal. There is no distension.     Palpations: Abdomen is soft. There is no mass.     Tenderness: There is no abdominal tenderness. There is no guarding or rebound.  Musculoskeletal:        General: Normal range of motion.     Cervical back: Normal range of motion and neck supple.  Lymphadenopathy:     Cervical: No cervical adenopathy.  Skin:    General: Skin is warm and dry.  Neurological:     Mental Status: He is alert and oriented to person, place, and time.     Deep Tendon Reflexes: Reflexes are normal and symmetric.  Psychiatric:        Behavior: Behavior normal.        Thought Content: Thought content normal.        Judgment: Judgment normal.     BP 125/72   Pulse 69   Ht 5\' 9"  (1.753 m)   Wt 204 lb (92.5 kg)   SpO2 99%   BMI 30.13 kg/m  Wt Readings from Last 3 Encounters:  02/24/21 204 lb (92.5 kg)  11/19/20 209 lb 12.8 oz (95.2 kg)  02/20/20 205 lb (93 kg)     Health Maintenance Due  Topic Date Due  . COLONOSCOPY (Pts 45-64yrs Insurance coverage will need to be confirmed)  Never done    There are no preventive care reminders to display for this patient.  Lab Results  Component Value Date   TSH 1.46 10/09/2018   Lab Results  Component Value Date   WBC 5.0 02/19/2020   HGB 16.1 02/19/2020   HCT 48.1 02/19/2020   MCV 87.1 02/19/2020   PLT 199 02/19/2020   Lab Results  Component Value Date   NA 140 02/19/2020   K 4.9 02/19/2020   CO2 28 02/19/2020   GLUCOSE 112 (H) 02/19/2020   BUN 19 02/19/2020   CREATININE 1.25 02/19/2020   BILITOT 1.0 02/19/2020   ALKPHOS 48 01/21/2016   AST 25 02/19/2020   ALT 43 02/19/2020   PROT 7.3 02/19/2020   ALBUMIN 4.4 01/21/2016  CALCIUM 10.0 02/19/2020   ANIONGAP 7 06/06/2016   Lab Results  Component Value Date   CHOL 160 02/19/2020   Lab Results   Component Value Date   HDL 47 02/19/2020   Lab Results  Component Value Date   LDLCALC 93 02/19/2020   Lab Results  Component Value Date   TRIG 100 02/19/2020   Lab Results  Component Value Date   CHOLHDL 3.4 02/19/2020   Lab Results  Component Value Date   HGBA1C 5.7 (A) 02/20/2020      Assessment & Plan:   Problem List Items Addressed This Visit   None   Visit Diagnoses    Health maintenance examination    -  Primary   Screening for malignant neoplasm of colon       Relevant Orders   Ambulatory referral to Gastroenterology     Keep up a regular exercise program and make sure you are eating a healthy diet Try to eat 4 servings of dairy a day, or if you are lactose intolerant take a calcium with vitamin D daily.  Your vaccines are up to date.  Discussed colon Can options. Desires colonoscopy.   Meds ordered this encounter  Medications  . rosuvastatin (CRESTOR) 10 MG tablet    Sig: Take 1 tablet (10 mg total) by mouth daily.    Dispense:  90 tablet    Refill:  3    Patient will call when ready to refill, this Rx was sent on 02/14/2021    Follow-up: No follow-ups on file.    Beatrice Lecher, MD

## 2021-02-26 LAB — COMPLETE METABOLIC PANEL WITHOUT GFR
AG Ratio: 2.1 (calc) (ref 1.0–2.5)
ALT: 41 U/L (ref 9–46)
AST: 23 U/L (ref 10–40)
Albumin: 4.8 g/dL (ref 3.6–5.1)
Alkaline phosphatase (APISO): 50 U/L (ref 36–130)
BUN: 17 mg/dL (ref 7–25)
CO2: 30 mmol/L (ref 20–32)
Calcium: 9.9 mg/dL (ref 8.6–10.3)
Chloride: 103 mmol/L (ref 98–110)
Creat: 1.07 mg/dL (ref 0.60–1.35)
GFR, Est African American: 96 mL/min/1.73m2 (ref >=60)
GFR, Est Non African American: 83 mL/min/1.73m2 (ref >=60)
Globulin: 2.3 g/dL (ref 1.9–3.7)
Glucose, Bld: 105 mg/dL — ABNORMAL HIGH (ref 65–99)
Potassium: 5.1 mmol/L (ref 3.5–5.3)
Sodium: 140 mmol/L (ref 135–146)
Total Bilirubin: 0.9 mg/dL (ref 0.2–1.2)
Total Protein: 7.1 g/dL (ref 6.1–8.1)

## 2021-02-26 LAB — HEMOGLOBIN A1C
Hgb A1c MFr Bld: 5.7 %{Hb} — ABNORMAL HIGH (ref <5.7)
Mean Plasma Glucose: 117 mg/dL
eAG (mmol/L): 6.5 mmol/L

## 2021-02-26 LAB — CBC WITH DIFFERENTIAL/PLATELET
Absolute Monocytes: 470 {cells}/uL (ref 200–950)
Basophils Absolute: 40 {cells}/uL (ref 0–200)
Basophils Relative: 0.8 %
Eosinophils Absolute: 150 {cells}/uL (ref 15–500)
Eosinophils Relative: 3 %
HCT: 48 % (ref 38.5–50.0)
Hemoglobin: 16.3 g/dL (ref 13.2–17.1)
Lymphs Abs: 1910 {cells}/uL (ref 850–3900)
MCH: 29.5 pg (ref 27.0–33.0)
MCHC: 34 g/dL (ref 32.0–36.0)
MCV: 87 fL (ref 80.0–100.0)
MPV: 10.8 fL (ref 7.5–12.5)
Monocytes Relative: 9.4 %
Neutro Abs: 2430 {cells}/uL (ref 1500–7800)
Neutrophils Relative %: 48.6 %
Platelets: 203 Thousand/uL (ref 140–400)
RBC: 5.52 Million/uL (ref 4.20–5.80)
RDW: 13.1 % (ref 11.0–15.0)
Total Lymphocyte: 38.2 %
WBC: 5 Thousand/uL (ref 3.8–10.8)

## 2021-02-26 LAB — LIPID PANEL W/REFLEX DIRECT LDL
Cholesterol: 152 mg/dL (ref <200)
HDL: 47 mg/dL (ref >=40)
LDL Cholesterol (Calc): 84 mg/dL
Non-HDL Cholesterol (Calc): 105 mg/dL (ref <130)
Total CHOL/HDL Ratio: 3.2 (calc) (ref <5.0)
Triglycerides: 113 mg/dL (ref <150)

## 2021-03-17 ENCOUNTER — Encounter: Payer: Self-pay | Admitting: Gastroenterology

## 2021-03-17 ENCOUNTER — Encounter: Payer: Self-pay | Admitting: Family Medicine

## 2021-03-18 NOTE — Telephone Encounter (Signed)
I called [facility] on 03/17/21 and was told due to them being short staffed they are behind on referrals but they would go ahead and call patient to schedule him. He is scheduled for 5/6 - [facility]

## 2021-04-29 ENCOUNTER — Encounter: Payer: 59 | Admitting: Gastroenterology

## 2021-05-17 ENCOUNTER — Ambulatory Visit (AMBULATORY_SURGERY_CENTER): Payer: 59

## 2021-05-17 ENCOUNTER — Other Ambulatory Visit: Payer: Self-pay

## 2021-05-17 DIAGNOSIS — Z1211 Encounter for screening for malignant neoplasm of colon: Secondary | ICD-10-CM

## 2021-05-17 MED ORDER — NA SULFATE-K SULFATE-MG SULF 17.5-3.13-1.6 GM/177ML PO SOLN: 1.0000 | Freq: Once | ORAL | 0 refills | Status: AC

## 2021-05-17 NOTE — Progress Notes (Signed)
Pt verified name, DOB, address and insurance during PV today  Pt mailed instruction packet to include copy of consent form to read and not return, and instructions.PV completed over the phone. Pt encouraged to call with questions or issues. My Chart instructions to pt as well    No allergies to soy or egg Pt is not on blood thinners or diet pills Denies issues with sedation/intubation Denies atrial flutter/fib Denies constipation   [facility] instructions given to pt  Pt is aware of Covid safety and care partner requirements.

## 2021-05-31 ENCOUNTER — Encounter: Payer: Self-pay | Admitting: Gastroenterology

## 2021-06-02 ENCOUNTER — Encounter: Payer: Self-pay | Admitting: Certified Registered Nurse Anesthetist

## 2021-06-03 ENCOUNTER — Encounter: Payer: Self-pay | Admitting: Gastroenterology

## 2021-06-03 ENCOUNTER — Ambulatory Visit (AMBULATORY_SURGERY_CENTER): Payer: 59 | Admitting: Gastroenterology

## 2021-06-03 ENCOUNTER — Other Ambulatory Visit: Payer: Self-pay

## 2021-06-03 VITALS — BP 120/70 | HR 68 | Temp 97.5°F | Resp 12 | Ht 69.11 in | Wt 195.0 lb

## 2021-06-03 DIAGNOSIS — K635 Polyp of colon: Secondary | ICD-10-CM

## 2021-06-03 DIAGNOSIS — D120 Benign neoplasm of cecum: Secondary | ICD-10-CM

## 2021-06-03 DIAGNOSIS — D124 Benign neoplasm of descending colon: Secondary | ICD-10-CM

## 2021-06-03 DIAGNOSIS — Z1211 Encounter for screening for malignant neoplasm of colon: Secondary | ICD-10-CM

## 2021-06-03 DIAGNOSIS — D122 Benign neoplasm of ascending colon: Secondary | ICD-10-CM

## 2021-06-03 MED ORDER — SODIUM CHLORIDE 0.9 % IV SOLN: 500.0000 mL | Freq: Once | INTRAVENOUS | Status: DC

## 2021-06-03 NOTE — Progress Notes (Signed)
Medical history reviewed with no changes noted since PV. VS assessed by C.W 

## 2021-06-03 NOTE — Progress Notes (Signed)
Called to room to assist during endoscopic procedure.  Patient ID and intended procedure confirmed with present staff. Received instructions for my participation in the procedure from the performing physician.  

## 2021-06-03 NOTE — Progress Notes (Signed)
Report given to PACU, vss 

## 2021-06-03 NOTE — Patient Instructions (Signed)
YOU HAD AN ENDOSCOPIC PROCEDURE TODAY AT THE [facility]:   Refer to the procedure report that was given to you for any specific questions about what was found during the examination.  If the procedure report does not answer your questions, please call your gastroenterologist to clarify.  If you requested that your care partner not be given the details of your procedure findings, then the procedure report has been included in a sealed envelope for you to review at your convenience later.  YOU SHOULD EXPECT: Some feelings of bloating in the abdomen. Passage of more gas than usual.  Walking can help get rid of the air that was put into your GI tract during the procedure and reduce the bloating. If you had a lower endoscopy (such as a colonoscopy or flexible sigmoidoscopy) you may notice spotting of blood in your stool or on the toilet paper. If you underwent a bowel prep for your procedure, you may not have a normal bowel movement for a few days.  Please Note:  You might notice some irritation and congestion in your nose or some drainage.  This is from the oxygen used during your procedure.  There is no need for concern and it should clear up in a day or so.  SYMPTOMS TO REPORT IMMEDIATELY:   Following lower endoscopy (colonoscopy or flexible sigmoidoscopy):  Excessive amounts of blood in the stool  Significant tenderness or worsening of abdominal pains  Swelling of the abdomen that is new, acute  Fever of 100F or higher   Following upper endoscopy (EGD)  Vomiting of blood or coffee ground material  New chest pain or pain under the shoulder blades  Painful or persistently difficult swallowing  New shortness of breath  Fever of 100F or higher  Black, tarry-looking stools  For urgent or emergent issues, a gastroenterologist can be reached at any hour by calling [phone]. Do not use MyChart messaging for urgent concerns.    DIET:  We do recommend a small meal at first, but  then you may proceed to your regular diet.  Drink plenty of fluids but you should avoid alcoholic beverages for 24 hours.  ACTIVITY:  You should plan to take it easy for the rest of today and you should NOT DRIVE or use heavy machinery until tomorrow (because of the sedation medicines used during the test).    FOLLOW UP: Our staff will call the number listed on your records 48-72 hours following your procedure to check on you and address any questions or concerns that you may have regarding the information given to you following your procedure. If we do not reach you, we will leave a message.  We will attempt to reach you two times.  During this call, we will ask if you have developed any symptoms of COVID 19. If you develop any symptoms (ie: fever, flu-like symptoms, shortness of breath, cough etc.) before then, please call [phone].  If you test positive for Covid 19 in the 2 weeks post procedure, please call and report this information to us.    If any biopsies were taken you will be contacted by phone or by letter within the next 1-3 weeks.  Please call us at [phone] if you have not heard about the biopsies in 3 weeks.    SIGNATURES/CONFIDENTIALITY: You and/or your care partner have signed paperwork which will be entered into your electronic medical record.  These signatures attest to the fact that that the information above on   your After Visit Summary has been reviewed and is understood.  Full responsibility of the confidentiality of this discharge information lies with you and/or your care-partner. 

## 2021-06-03 NOTE — Op Note (Signed)
[facility] Patient Name: Andrew Frost Procedure Date: 06/03/2021 1:21 PM MRN: [id number] Endoscopist: Mallie Mussel L. Loletha Carrow , MD Age: 45 Referring MD:  Date of Birth: Oct 20, 1976 Gender: Male Account #: [id number] Procedure:                Colonoscopy Indications:              Screening for colorectal malignant neoplasm, This                            is the patient's first colonoscopy Medicines:                Monitored Anesthesia Care Procedure:                Pre-Anesthesia Assessment:                           - Prior to the procedure, a History and Physical                            was performed, and patient medications and                            allergies were reviewed. The patient's tolerance of                            previous anesthesia was also reviewed. The risks                            and benefits of the procedure and the sedation                            options and risks were discussed with the patient.                            All questions were answered, and informed consent                            was obtained. Prior Anticoagulants: The patient has                            taken no previous anticoagulant or antiplatelet                            agents. ASA Grade Assessment: II - A patient with                            mild systemic disease. After reviewing the risks                            and benefits, the patient was deemed in                            satisfactory condition to undergo the procedure.  After obtaining informed consent, the colonoscope                            was passed under direct vision. Throughout the                            procedure, the patient's blood pressure, pulse, and                            oxygen saturations were monitored continuously. The                            CF [id number] #[id number] was introduced through the anus                            and advanced to the the  cecum, identified by                            appendiceal orifice and ileocecal valve. The                            colonoscopy was performed without difficulty. The                            patient tolerated the procedure well. The quality                            of the bowel preparation was fair in the right                            colon, better in remainder with lavage. The                            ileocecal valve, appendiceal orifice, and rectum                            were photographed. The bowel preparation used was                            SUPREP. Scope In: 1:34:12 PM Scope Out: 2:00:53 PM Scope Withdrawal Time: 0 hours 23 minutes 58 seconds  Total Procedure Duration: 0 hours 26 minutes 41 seconds  Findings:                 The perianal and digital rectal examinations were                            normal.                           Diverticula were found in the entire colon.                           A 5 mm polyp was found in the cecum. The polyp was  semi-sessile. The polyp was removed with a cold                            snare. Resection and retrieval were complete.                           A 12-15 mm polyp was found in the proximal                            ascending colon. The polyp was semi-sessile. The                            polyp was removed with a piecemeal technique using                            a cold snare. Resection and retrieval were complete.                           A diminutive polyp was found in the proximal                            descending colon. The polyp was flat. The polyp was                            removed with a cold snare. Resection and retrieval                            were complete.                           The exam was otherwise without abnormality on                            direct and retroflexion views. Complications:            No immediate complications. Estimated Blood Loss:      Estimated blood loss was minimal. Impression:               - Preparation of the colon was fair.                           - Diverticulosis in the entire examined colon.                           - One 5 mm polyp in the cecum, removed with a cold                            snare. Resected and retrieved.                           - One 12-15 mm polyp in the proximal ascending                            colon, removed piecemeal using a cold snare.  Resected and retrieved.                           - One diminutive polyp in the proximal descending                            colon, removed with a cold snare. Resected and                            retrieved.                           - The examination was otherwise normal on direct                            and retroflexion views. Recommendation:           - Patient has a contact number available for                            emergencies. The signs and symptoms of potential                            delayed complications were discussed with the                            patient. Return to normal activities tomorrow.                            Written discharge instructions were provided to the                            patient.                           - Resume previous diet.                           - Continue present medications.                           - Await pathology results.                           - Repeat colonoscopy in 1 year for surveillance due                            to suboptimal right colon prep. Plenvu prep and                            more water consumption for next bowel prep. Andrew Frost L. Loletha Carrow, MD 06/03/2021 2:09:25 PM This report has been signed electronically.

## 2021-06-07 ENCOUNTER — Telehealth: Payer: Self-pay | Admitting: *Deleted

## 2021-06-07 NOTE — Telephone Encounter (Signed)
  Follow up Call-  Call back number 06/03/2021  Post procedure Call Back phone  # [phone]  Permission to leave phone message Yes  Some recent data might be hidden     Patient questions:  Do you have a fever, pain , or abdominal swelling? No. Pain Score  0 *  Have you tolerated food without any problems? yes  Have you been able to return to your normal activities? yes  Do you have any questions about your discharge instructions: Diet   No. Medications  No. Follow up visit  No.  Do you have questions or concerns about your Care? No.  Actions: * If pain score is 4 or above: No action needed, pain <4.

## 2021-06-13 ENCOUNTER — Encounter: Payer: Self-pay | Admitting: Gastroenterology

## 2021-07-06 ENCOUNTER — Telehealth: Payer: Self-pay | Admitting: *Deleted

## 2021-07-06 NOTE — Telephone Encounter (Signed)
Pt called and wanted to inform the office that his ex-wife Mickel Baas) passed away on 04-01-2023.   He stated that he wasn't sure if Dr. Madilyn Fireman knew and wasn't sure if there was anything that he needed to do. I have marked her chart as deceased.

## 2021-07-08 NOTE — Telephone Encounter (Signed)
Andrew Frost, funeral today in [facility], hospital. He and kids are as well as can be.  Encouragedhim to reach out if he needed anything.  Sounds like he has a good support structure in place.

## 2022-05-19 ENCOUNTER — Encounter: Payer: 59 | Admitting: Gastroenterology

## 2022-05-23 ENCOUNTER — Other Ambulatory Visit: Payer: Self-pay | Admitting: Family Medicine

## 2022-05-24 NOTE — Telephone Encounter (Signed)
Patient informed of information below. AMUCK

## 2022-05-24 NOTE — Telephone Encounter (Signed)
Thanks for the update. Please inform pt that we did send over a 90 day supply for now. And will look forward to seeing him soon.

## 2022-05-24 NOTE — Telephone Encounter (Signed)
Patient stated he is currently out of network with his insurance with [place] & he was trying to ride it out until November. He stated he will be getting a new insurance in November that will be within [facility], but that he really does not want to change providers as he loves Dr. Madilyn Fireman and Dr. Madilyn Fireman knows him and his family. Please Advise. AMUCK

## 2022-05-24 NOTE — Telephone Encounter (Signed)
Please call pt and advise him that he is due for fasting labs and an OV for refills. thanks

## 2022-08-22 ENCOUNTER — Other Ambulatory Visit: Payer: Self-pay | Admitting: Family Medicine

## 2022-11-20 ENCOUNTER — Other Ambulatory Visit: Payer: Self-pay | Admitting: Family Medicine

## 2023-01-01 ENCOUNTER — Other Ambulatory Visit: Payer: Self-pay | Admitting: *Deleted

## 2023-01-01 DIAGNOSIS — Z0000 Encounter for general adult medical examination without abnormal findings: Secondary | ICD-10-CM

## 2023-01-02 ENCOUNTER — Encounter: Payer: Self-pay | Admitting: Family Medicine

## 2023-01-02 ENCOUNTER — Ambulatory Visit (INDEPENDENT_AMBULATORY_CARE_PROVIDER_SITE_OTHER): Payer: BLUE CROSS/BLUE SHIELD | Admitting: Family Medicine

## 2023-01-02 VITALS — BP 130/71 | HR 70 | Ht 69.0 in | Wt 207.0 lb

## 2023-01-02 DIAGNOSIS — Z0000 Encounter for general adult medical examination without abnormal findings: Secondary | ICD-10-CM | POA: Diagnosis not present

## 2023-01-02 DIAGNOSIS — Z1211 Encounter for screening for malignant neoplasm of colon: Secondary | ICD-10-CM

## 2023-01-02 NOTE — Progress Notes (Signed)
Complete physical exam  Patient: Andrew Frost   DOB: 11-07-1976   47 y.o. Male  MRN: [id number]  Subjective:    Chief Complaint  Patient presents with   Annual Exam    Andrew Frost is a 47 y.o. male who presents today for a complete physical exam. He reports consuming a general and low fat diet. The patient does not participate in regular exercise at present. He generally feels well. He reports sleeping  He does not have additional problems to discuss today.    Most recent fall risk assessment:    01/02/2023    9:08 AM  Fall Risk   Falls in the past year? 0  Number falls in past yr: 0  Injury with Fall? 0  Risk for fall due to : No Fall Risks  Follow up Falls evaluation completed     Most recent depression screenings:    01/02/2023    9:36 AM 02/24/2021    9:14 AM  PHQ 2/9 Scores  PHQ - 2 Score 0 0        Patient Care Team: Hali Marry, MD as PCP - General   Outpatient Medications Prior to Visit  Medication Sig   aspirin 81 MG EC tablet TAKE 1 TABLET (81 MG TOTAL) BY MOUTH DAILY.   BLACK ELDERBERRY PO Take 1 each by mouth daily.   Calcium Polycarbophil (FIBER-CAPS PO) Take by mouth.   cholecalciferol (VITAMIN D3) 25 MCG (1000 UNIT) tablet Take 1,000 Units by mouth daily.   Cyanocobalamin (VITAMIN B12) 1000 MCG TBCR Take 1 tablet by mouth daily.    Dandelion 525 MG CAPS Take 525 mg by mouth daily.   docusate sodium (STOOL SOFTENER) 100 MG capsule Take 100 mg by mouth daily as needed.   Fenugreek 610 MG CAPS Take 610 mg by mouth daily.   Ginkgo 60 MG TABS Take 60 mg by mouth daily.   MEGARED OMEGA-3 KRILL OIL 500 MG CAPS Take 1,000 mg by mouth daily.    Misc Natural Products (YUMVS BEET ROOT-TART CHERRY PO) Take 2,000 mg by mouth daily.   Multiple Vitamin (MULTIVITAMIN) tablet Take 1 tablet by mouth daily.   Probiotic Product (PROBIOTIC PO) Take 1 tablet by mouth daily.   rosuvastatin (CRESTOR) 10 MG tablet TAKE 1 TABLET BY MOUTH EVERY DAY   Saw  Palmetto 450 MG CAPS Take 450 mg by mouth daily.   No facility-administered medications prior to visit.    ROS        Objective:     BP 130/71   Pulse 70   Ht '5\' 9"'$  (1.753 m)   Wt 207 lb (93.9 kg)   SpO2 99%   BMI 30.57 kg/m    Physical Exam Constitutional:      Appearance: He is well-developed.  HENT:     Head: Normocephalic and atraumatic.     Right Ear: Tympanic membrane, ear canal and external ear normal.     Left Ear: Tympanic membrane, ear canal and external ear normal.     Nose: Nose normal.     Mouth/Throat:     Pharynx: Oropharynx is clear.  Eyes:     Conjunctiva/sclera: Conjunctivae normal.     Pupils: Pupils are equal, round, and reactive to light.  Neck:     Thyroid: No thyromegaly.  Cardiovascular:     Rate and Rhythm: Normal rate and regular rhythm.     Heart sounds: Normal heart sounds.  Pulmonary:     Effort:  Pulmonary effort is normal.     Breath sounds: Normal breath sounds.  Abdominal:     General: Bowel sounds are normal. There is no distension.     Palpations: Abdomen is soft. There is no mass.     Tenderness: There is no abdominal tenderness. There is no guarding or rebound.  Musculoskeletal:        General: Normal range of motion.     Cervical back: Normal range of motion and neck supple. No tenderness.  Lymphadenopathy:     Cervical: No cervical adenopathy.  Skin:    General: Skin is warm and dry.     Comments: Seborrheic keratosis on top of the scalp.  Neurological:     Mental Status: He is alert and oriented to person, place, and time.     Deep Tendon Reflexes: Reflexes are normal and symmetric.  Psychiatric:        Behavior: Behavior normal.        Thought Content: Thought content normal.        Judgment: Judgment normal.      No results found for any visits on 01/02/23.     Assessment & Plan:    Routine Health Maintenance and Physical Exam  Immunization History  Administered Date(s) Administered    Influenza,inj,Quad PF,6+ Mos 09/29/2016, 10/08/2018   Td 12/12/2003   Tdap 04/23/2015    Health Maintenance  Topic Date Due   COLONOSCOPY (Pts 45-28yr Insurance coverage will need to be confirmed)  06/03/2022   INFLUENZA VACCINE  03/11/2023 (Originally 07/11/2022)   Hepatitis C Screening  01/03/2024 (Originally 02/02/1993)   COVID-19 Vaccine (1) 01/19/2024 (Originally 08/03/1976)   HIV Screening  02/19/2029 (Originally 02/02/1990)   DTaP/Tdap/Td (3 - Td or Tdap) 04/22/2025   Pneumococcal Vaccine 118635Years old  Aged Out   HPV VACCINES  Aged Out    Discussed health benefits of physical activity, and encouraged him to engage in regular exercise appropriate for his age and condition.  Problem List Items Addressed This Visit   None Visit Diagnoses     Wellness examination    -  Primary   Screen for colon cancer       Relevant Orders   Ambulatory referral to Gastroenterology       Keep up a regular exercise program and make sure you are eating a healthy diet.  Gust setting small goals. Try to eat 4 servings of dairy a day, or if you are lactose intolerant take a calcium with vitamin D daily.  Your vaccines are up to date.  Follow-up to GI to get colon cancer screening updated.  His last 1 was abnormal so he is due for a recall.  No follow-ups on file.     CBeatrice Lecher MD

## 2023-01-03 ENCOUNTER — Ambulatory Visit (AMBULATORY_SURGERY_CENTER): Payer: BLUE CROSS/BLUE SHIELD | Admitting: *Deleted

## 2023-01-03 VITALS — Ht 69.0 in | Wt 207.0 lb

## 2023-01-03 DIAGNOSIS — Z86010 Personal history of colonic polyps: Secondary | ICD-10-CM

## 2023-01-03 LAB — CBC WITH DIFFERENTIAL/PLATELET
Absolute Monocytes: 348 cells/uL (ref 200–950)
Basophils Absolute: 22 cells/uL (ref 0–200)
Basophils Relative: 0.5 %
Eosinophils Absolute: 88 cells/uL (ref 15–500)
Eosinophils Relative: 2 %
HCT: 48.7 % (ref 38.5–50.0)
Hemoglobin: 16.6 g/dL (ref 13.2–17.1)
Lymphs Abs: 1606 cells/uL (ref 850–3900)
MCH: 30.1 pg (ref 27.0–33.0)
MCHC: 34.1 g/dL (ref 32.0–36.0)
MCV: 88.2 fL (ref 80.0–100.0)
MPV: 10.5 fL (ref 7.5–12.5)
Monocytes Relative: 7.9 %
Neutro Abs: 2336 cells/uL (ref 1500–7800)
Neutrophils Relative %: 53.1 %
Platelets: 202 10*3/uL (ref 140–400)
RBC: 5.52 10*6/uL (ref 4.20–5.80)
RDW: 13.1 % (ref 11.0–15.0)
Total Lymphocyte: 36.5 %
WBC: 4.4 10*3/uL (ref 3.8–10.8)

## 2023-01-03 LAB — COMPLETE METABOLIC PANEL WITH GFR
AG Ratio: 2 (calc) (ref 1.0–2.5)
ALT: 37 U/L (ref 9–46)
AST: 17 U/L (ref 10–40)
Albumin: 5 g/dL (ref 3.6–5.1)
Alkaline phosphatase (APISO): 51 U/L (ref 36–130)
BUN: 16 mg/dL (ref 7–25)
CO2: 27 mmol/L (ref 20–32)
Calcium: 9.9 mg/dL (ref 8.6–10.3)
Chloride: 103 mmol/L (ref 98–110)
Creat: 1.09 mg/dL (ref 0.60–1.29)
Globulin: 2.5 g/dL (calc) (ref 1.9–3.7)
Glucose, Bld: 87 mg/dL (ref 65–99)
Potassium: 4.6 mmol/L (ref 3.5–5.3)
Sodium: 141 mmol/L (ref 135–146)
Total Bilirubin: 0.8 mg/dL (ref 0.2–1.2)
Total Protein: 7.5 g/dL (ref 6.1–8.1)
eGFR: 84 mL/min/{1.73_m2} (ref >=60)

## 2023-01-03 LAB — LIPID PANEL W/REFLEX DIRECT LDL
Cholesterol: 174 mg/dL (ref <200)
HDL: 61 mg/dL (ref >=40)
LDL Cholesterol (Calc): 91 mg/dL (calc)
Non-HDL Cholesterol (Calc): 113 mg/dL (calc) (ref <130)
Total CHOL/HDL Ratio: 2.9 (calc) (ref <5.0)
Triglycerides: 128 mg/dL (ref <150)

## 2023-01-03 LAB — HEMOGLOBIN A1C
Hgb A1c MFr Bld: 6 % of total Hgb — ABNORMAL HIGH (ref <5.7)
Mean Plasma Glucose: 126 mg/dL
eAG (mmol/L): 7 mmol/L

## 2023-01-03 MED ORDER — PLENVU 140 G PO SOLR: ORAL | 0 refills | Status: AC

## 2023-01-03 NOTE — Progress Notes (Signed)
Hi Andrew Frost, your A1C is up a little from last year. All other labs look good.  Continue to work on [employer] and exercise.

## 2023-01-03 NOTE — Progress Notes (Signed)
No egg or soy allergy known to patient  No issues known to pt with past sedation with any surgeries or procedures Patient denies ever being told they had issues or difficulty with intubation  No FH of Malignant Hyperthermia Pt is not on diet pills Pt is not on  home 02  Pt is not on blood thinners  Pt denies issues with constipation  No A fib or A flutter Have any cardiac testing pending--no Pt instructed to use [email or web address] or GoodRx for a price reduction on prep   

## 2023-01-23 ENCOUNTER — Other Ambulatory Visit: Payer: Self-pay | Admitting: *Deleted

## 2023-01-23 DIAGNOSIS — Z1211 Encounter for screening for malignant neoplasm of colon: Secondary | ICD-10-CM

## 2023-02-02 ENCOUNTER — Encounter: Payer: BLUE CROSS/BLUE SHIELD | Admitting: Gastroenterology

## 2023-02-16 ENCOUNTER — Other Ambulatory Visit: Payer: Self-pay | Admitting: Family Medicine

## 2023-05-14 ENCOUNTER — Other Ambulatory Visit: Payer: Self-pay | Admitting: Family Medicine

## 2024-02-10 ENCOUNTER — Other Ambulatory Visit: Payer: Self-pay | Admitting: Family Medicine
